# Patient Record
Sex: Female | Born: 1985 | Race: White | Hispanic: No | Marital: Married | Smoking: Former smoker
Health system: Southern US, Community
[De-identification: ages and names within clinical notes are randomized; demographics above are authoritative.]

## PROBLEM LIST (undated history)

## (undated) DIAGNOSIS — O139 Gestational [pregnancy-induced] hypertension without significant proteinuria, unspecified trimester: Secondary | ICD-10-CM

## (undated) DIAGNOSIS — O24419 Gestational diabetes mellitus in pregnancy, unspecified control: Secondary | ICD-10-CM

## (undated) DIAGNOSIS — R569 Unspecified convulsions: Secondary | ICD-10-CM

## (undated) DIAGNOSIS — R87810 Cervical high risk human papillomavirus (HPV) DNA test positive: Principal | ICD-10-CM

## (undated) DIAGNOSIS — R7401 Elevation of levels of liver transaminase levels: Secondary | ICD-10-CM

## (undated) DIAGNOSIS — R740 Nonspecific elevation of levels of transaminase and lactic acid dehydrogenase [LDH]: Secondary | ICD-10-CM

## (undated) DIAGNOSIS — M810 Age-related osteoporosis without current pathological fracture: Secondary | ICD-10-CM

## (undated) DIAGNOSIS — I639 Cerebral infarction, unspecified: Secondary | ICD-10-CM

## (undated) DIAGNOSIS — K219 Gastro-esophageal reflux disease without esophagitis: Secondary | ICD-10-CM

## (undated) DIAGNOSIS — F329 Major depressive disorder, single episode, unspecified: Secondary | ICD-10-CM

## (undated) DIAGNOSIS — F1911 Other psychoactive substance abuse, in remission: Secondary | ICD-10-CM

## (undated) DIAGNOSIS — F32A Depression, unspecified: Secondary | ICD-10-CM

## (undated) DIAGNOSIS — D649 Anemia, unspecified: Secondary | ICD-10-CM

## (undated) DIAGNOSIS — F1021 Alcohol dependence, in remission: Secondary | ICD-10-CM

## (undated) DIAGNOSIS — F419 Anxiety disorder, unspecified: Secondary | ICD-10-CM

## (undated) DIAGNOSIS — R87610 Atypical squamous cells of undetermined significance on cytologic smear of cervix (ASC-US): Secondary | ICD-10-CM

## (undated) HISTORY — DX: Major depressive disorder, single episode, unspecified: F32.9

## (undated) HISTORY — DX: Alcohol dependence, in remission: F10.21

## (undated) HISTORY — DX: Anxiety disorder, unspecified: F41.9

## (undated) HISTORY — DX: Cerebral infarction, unspecified: I63.9

## (undated) HISTORY — DX: Cervical high risk human papillomavirus (HPV) DNA test positive: R87.810

## (undated) HISTORY — DX: Elevation of levels of liver transaminase levels: R74.01

## (undated) HISTORY — DX: Depression, unspecified: F32.A

## (undated) HISTORY — PX: ELBOW SURGERY: SHX618

## (undated) HISTORY — DX: Age-related osteoporosis without current pathological fracture: M81.0

## (undated) HISTORY — DX: Atypical squamous cells of undetermined significance on cytologic smear of cervix (ASC-US): R87.610

## (undated) HISTORY — DX: Other psychoactive substance abuse, in remission: F19.11

## (undated) HISTORY — PX: TONSILLECTOMY: SHX5217

## (undated) HISTORY — PX: KNEE ARTHROSCOPY: SHX127

## (undated) HISTORY — DX: Gestational diabetes mellitus in pregnancy, unspecified control: O24.419

## (undated) HISTORY — DX: Nonspecific elevation of levels of transaminase and lactic acid dehydrogenase (ldh): R74.0

---

## 2010-05-27 ENCOUNTER — Inpatient Hospital Stay (HOSPITAL_COMMUNITY): Admission: EM | Admit: 2010-05-27 | Discharge: 2010-05-29 | Payer: Self-pay | Admitting: Emergency Medicine

## 2010-10-03 DIAGNOSIS — G459 Transient cerebral ischemic attack, unspecified: Secondary | ICD-10-CM

## 2010-10-03 DIAGNOSIS — I639 Cerebral infarction, unspecified: Secondary | ICD-10-CM

## 2010-10-03 HISTORY — DX: Transient cerebral ischemic attack, unspecified: G45.9

## 2010-10-03 HISTORY — DX: Cerebral infarction, unspecified: I63.9

## 2010-12-17 LAB — BASIC METABOLIC PANEL
BUN: 3 mg/dL — ABNORMAL LOW (ref 6–23)
CO2: 22 mEq/L (ref 19–32)
Calcium: 9.1 mg/dL (ref 8.4–10.5)
Chloride: 107 mEq/L (ref 96–112)
Creatinine, Ser: 0.65 mg/dL (ref 0.4–1.2)
GFR calc Af Amer: >60 mL/min (ref >60)
GFR calc non Af Amer: >60 mL/min (ref >60)
Glucose, Bld: 123 mg/dL — ABNORMAL HIGH (ref 70–99)
Potassium: 3.8 mEq/L (ref 3.5–5.1)
Sodium: 142 mEq/L (ref 135–145)

## 2010-12-17 LAB — URINALYSIS, ROUTINE W REFLEX MICROSCOPIC
Bilirubin Urine: NEGATIVE
Glucose, UA: 100 mg/dL — AB
Hgb urine dipstick: NEGATIVE
Ketones, ur: NEGATIVE mg/dL
Nitrite: POSITIVE — AB
Protein, ur: NEGATIVE mg/dL
Specific Gravity, Urine: 1.006 (ref 1.005–1.030)
Urobilinogen, UA: 0.2 mg/dL (ref 0.0–1.0)
pH: 5 (ref 5.0–8.0)

## 2010-12-17 LAB — HEPATITIS A ANTIBODY, TOTAL: Hep A Total Ab: NEGATIVE

## 2010-12-17 LAB — HEPATIC FUNCTION PANEL
ALT: 41 U/L — ABNORMAL HIGH (ref 0–35)
AST: 75 U/L — ABNORMAL HIGH (ref 0–37)
Albumin: 4 g/dL (ref 3.5–5.2)
Alkaline Phosphatase: 105 U/L (ref 39–117)
Bilirubin, Direct: <0.1 mg/dL (ref 0.0–0.3)
Total Bilirubin: 0.1 mg/dL — ABNORMAL LOW (ref 0.3–1.2)
Total Protein: 7.2 g/dL (ref 6.0–8.3)

## 2010-12-17 LAB — DIFFERENTIAL
Basophils Absolute: 0 10*3/uL (ref 0.0–0.1)
Basophils Absolute: 0 10*3/uL (ref 0.0–0.1)
Basophils Absolute: 0 10*3/uL (ref 0.0–0.1)
Basophils Relative: 0 % (ref 0–1)
Basophils Relative: 0 % (ref 0–1)
Basophils Relative: 0 % (ref 0–1)
Eosinophils Absolute: 0 10*3/uL (ref 0.0–0.7)
Eosinophils Absolute: 0.1 10*3/uL (ref 0.0–0.7)
Eosinophils Absolute: 0.1 10*3/uL (ref 0.0–0.7)
Eosinophils Relative: 0 % (ref 0–5)
Eosinophils Relative: 1 % (ref 0–5)
Eosinophils Relative: 1 % (ref 0–5)
Lymphocytes Relative: 18 % (ref 12–46)
Lymphocytes Relative: 22 % (ref 12–46)
Lymphocytes Relative: 24 % (ref 12–46)
Lymphs Abs: 1.6 10*3/uL (ref 0.7–4.0)
Lymphs Abs: 1.7 10*3/uL (ref 0.7–4.0)
Lymphs Abs: 2 10*3/uL (ref 0.7–4.0)
Monocytes Absolute: 0.3 10*3/uL (ref 0.1–1.0)
Monocytes Absolute: 0.5 10*3/uL (ref 0.1–1.0)
Monocytes Absolute: 0.5 10*3/uL (ref 0.1–1.0)
Monocytes Relative: 4 % (ref 3–12)
Monocytes Relative: 6 % (ref 3–12)
Monocytes Relative: 7 % (ref 3–12)
Neutro Abs: 4.9 10*3/uL (ref 1.7–7.7)
Neutro Abs: 6.4 10*3/uL (ref 1.7–7.7)
Neutro Abs: 7.1 10*3/uL (ref 1.7–7.7)
Neutrophils Relative %: 69 % (ref 43–77)
Neutrophils Relative %: 71 % (ref 43–77)
Neutrophils Relative %: 78 % — ABNORMAL HIGH (ref 43–77)

## 2010-12-17 LAB — COMPREHENSIVE METABOLIC PANEL
ALT: 23 U/L (ref 0–35)
ALT: 24 U/L (ref 0–35)
AST: 33 U/L (ref 0–37)
AST: 38 U/L — ABNORMAL HIGH (ref 0–37)
Albumin: 2.9 g/dL — ABNORMAL LOW (ref 3.5–5.2)
Albumin: 3.2 g/dL — ABNORMAL LOW (ref 3.5–5.2)
Alkaline Phosphatase: 75 U/L (ref 39–117)
Alkaline Phosphatase: 81 U/L (ref 39–117)
BUN: 2 mg/dL — ABNORMAL LOW (ref 6–23)
BUN: 3 mg/dL — ABNORMAL LOW (ref 6–23)
CO2: 23 mEq/L (ref 19–32)
CO2: 25 mEq/L (ref 19–32)
Calcium: 8.3 mg/dL — ABNORMAL LOW (ref 8.4–10.5)
Calcium: 9.1 mg/dL (ref 8.4–10.5)
Chloride: 105 mEq/L (ref 96–112)
Chloride: 108 mEq/L (ref 96–112)
Creatinine, Ser: 0.52 mg/dL (ref 0.4–1.2)
Creatinine, Ser: 0.62 mg/dL (ref 0.4–1.2)
GFR calc Af Amer: >60 mL/min (ref >60)
GFR calc Af Amer: >60 mL/min (ref >60)
GFR calc non Af Amer: >60 mL/min (ref >60)
GFR calc non Af Amer: >60 mL/min (ref >60)
Glucose, Bld: 101 mg/dL — ABNORMAL HIGH (ref 70–99)
Glucose, Bld: 126 mg/dL — ABNORMAL HIGH (ref 70–99)
Potassium: 3.4 mEq/L — ABNORMAL LOW (ref 3.5–5.1)
Potassium: 4.1 mEq/L (ref 3.5–5.1)
Sodium: 136 mEq/L (ref 135–145)
Sodium: 139 mEq/L (ref 135–145)
Total Bilirubin: 0.3 mg/dL (ref 0.3–1.2)
Total Bilirubin: 0.6 mg/dL (ref 0.3–1.2)
Total Protein: 5.4 g/dL — ABNORMAL LOW (ref 6.0–8.3)
Total Protein: 6.2 g/dL (ref 6.0–8.3)

## 2010-12-17 LAB — RAPID URINE DRUG SCREEN, HOSP PERFORMED
Amphetamines: NOT DETECTED
Barbiturates: NOT DETECTED
Benzodiazepines: POSITIVE — AB
Cocaine: NOT DETECTED
Opiates: NOT DETECTED
Tetrahydrocannabinol: NOT DETECTED

## 2010-12-17 LAB — CBC
HCT: 35.6 % — ABNORMAL LOW (ref 36.0–46.0)
HCT: 40.1 % (ref 36.0–46.0)
HCT: 43.1 % (ref 36.0–46.0)
Hemoglobin: 12.4 g/dL (ref 12.0–15.0)
Hemoglobin: 13.9 g/dL (ref 12.0–15.0)
Hemoglobin: 15.1 g/dL — ABNORMAL HIGH (ref 12.0–15.0)
MCH: 34.1 pg — ABNORMAL HIGH (ref 26.0–34.0)
MCH: 34.3 pg — ABNORMAL HIGH (ref 26.0–34.0)
MCH: 35.1 pg — ABNORMAL HIGH (ref 26.0–34.0)
MCHC: 34.7 g/dL (ref 30.0–36.0)
MCHC: 34.8 g/dL (ref 30.0–36.0)
MCHC: 35 g/dL (ref 30.0–36.0)
MCV: 100.2 fL — ABNORMAL HIGH (ref 78.0–100.0)
MCV: 97.8 fL (ref 78.0–100.0)
MCV: 99 fL (ref 78.0–100.0)
Platelets: 146 10*3/uL — ABNORMAL LOW (ref 150–400)
Platelets: 148 10*3/uL — ABNORMAL LOW (ref 150–400)
Platelets: 188 10*3/uL (ref 150–400)
RBC: 3.64 MIL/uL — ABNORMAL LOW (ref 3.87–5.11)
RBC: 4.05 MIL/uL (ref 3.87–5.11)
RBC: 4.3 MIL/uL (ref 3.87–5.11)
RDW: 13.2 % (ref 11.5–15.5)
RDW: 13.4 % (ref 11.5–15.5)
RDW: 13.7 % (ref 11.5–15.5)
WBC: 7.1 10*3/uL (ref 4.0–10.5)
WBC: 9 10*3/uL (ref 4.0–10.5)
WBC: 9.1 10*3/uL (ref 4.0–10.5)

## 2010-12-17 LAB — URINE MICROSCOPIC-ADD ON

## 2010-12-17 LAB — PROTIME-INR
INR: 0.89 (ref 0.00–1.49)
Prothrombin Time: 12.3 seconds (ref 11.6–15.2)

## 2010-12-17 LAB — LACTIC ACID, PLASMA: Lactic Acid, Venous: 2.4 mmol/L — ABNORMAL HIGH (ref 0.5–2.2)

## 2010-12-17 LAB — ETHANOL
Alcohol, Ethyl (B): 130 mg/dL — ABNORMAL HIGH (ref 0–10)
Alcohol, Ethyl (B): 408 mg/dL (ref 0–10)

## 2010-12-17 LAB — POCT I-STAT, CHEM 8
BUN: <3 mg/dL — ABNORMAL LOW (ref 6–23)
Calcium, Ion: 1.07 mmol/L — ABNORMAL LOW (ref 1.12–1.32)
Chloride: 106 mEq/L (ref 96–112)
Creatinine, Ser: 1 mg/dL (ref 0.4–1.2)
Glucose, Bld: 122 mg/dL — ABNORMAL HIGH (ref 70–99)
HCT: 46 % (ref 36.0–46.0)
Hemoglobin: 15.6 g/dL — ABNORMAL HIGH (ref 12.0–15.0)
Potassium: 3.8 mEq/L (ref 3.5–5.1)
Sodium: 141 mEq/L (ref 135–145)
TCO2: 20 mmol/L (ref 0–100)

## 2010-12-17 LAB — LIPASE, BLOOD: Lipase: 29 U/L (ref 11–59)

## 2010-12-17 LAB — ACETAMINOPHEN LEVEL: Acetaminophen (Tylenol), Serum: <10 ug/mL — ABNORMAL LOW (ref 10–30)

## 2010-12-17 LAB — APTT: aPTT: 25 seconds (ref 24–37)

## 2010-12-17 LAB — MAGNESIUM
Magnesium: 1.5 mg/dL (ref 1.5–2.5)
Magnesium: 1.8 mg/dL (ref 1.5–2.5)

## 2010-12-17 LAB — POCT PREGNANCY, URINE: Preg Test, Ur: NEGATIVE

## 2010-12-17 LAB — HEPATITIS B CORE ANTIBODY, TOTAL: Hep B Core Total Ab: NEGATIVE

## 2010-12-17 LAB — GAMMA GT: GGT: 196 U/L — ABNORMAL HIGH (ref 7–51)

## 2010-12-17 LAB — HEPATITIS C ANTIBODY: HCV Ab: NEGATIVE

## 2010-12-22 ENCOUNTER — Inpatient Hospital Stay: Payer: Self-pay | Admitting: Psychiatry

## 2011-03-04 ENCOUNTER — Emergency Department (HOSPITAL_COMMUNITY): Payer: Self-pay

## 2011-03-04 ENCOUNTER — Emergency Department (HOSPITAL_COMMUNITY)
Admission: EM | Admit: 2011-03-04 | Discharge: 2011-03-04 | Disposition: A | Discharge status: Home / Self Care | Payer: Self-pay | Attending: Emergency Medicine | Admitting: Emergency Medicine

## 2011-03-04 DIAGNOSIS — S060X0A Concussion without loss of consciousness, initial encounter: Secondary | ICD-10-CM | POA: Insufficient documentation

## 2011-03-04 DIAGNOSIS — S9030XA Contusion of unspecified foot, initial encounter: Secondary | ICD-10-CM | POA: Insufficient documentation

## 2011-03-04 DIAGNOSIS — Z79899 Other long term (current) drug therapy: Secondary | ICD-10-CM | POA: Insufficient documentation

## 2011-03-04 DIAGNOSIS — Y92009 Unspecified place in unspecified non-institutional (private) residence as the place of occurrence of the external cause: Secondary | ICD-10-CM | POA: Insufficient documentation

## 2011-03-04 DIAGNOSIS — S92309A Fracture of unspecified metatarsal bone(s), unspecified foot, initial encounter for closed fracture: Secondary | ICD-10-CM | POA: Insufficient documentation

## 2011-03-04 DIAGNOSIS — R609 Edema, unspecified: Secondary | ICD-10-CM | POA: Insufficient documentation

## 2011-03-04 DIAGNOSIS — M79609 Pain in unspecified limb: Secondary | ICD-10-CM | POA: Insufficient documentation

## 2011-03-04 DIAGNOSIS — F329 Major depressive disorder, single episode, unspecified: Secondary | ICD-10-CM | POA: Insufficient documentation

## 2011-03-04 DIAGNOSIS — R42 Dizziness and giddiness: Secondary | ICD-10-CM | POA: Insufficient documentation

## 2011-03-04 DIAGNOSIS — M7989 Other specified soft tissue disorders: Secondary | ICD-10-CM | POA: Insufficient documentation

## 2011-03-04 DIAGNOSIS — W1809XA Striking against other object with subsequent fall, initial encounter: Secondary | ICD-10-CM | POA: Insufficient documentation

## 2011-03-04 DIAGNOSIS — F3289 Other specified depressive episodes: Secondary | ICD-10-CM | POA: Insufficient documentation

## 2011-03-04 DIAGNOSIS — M899 Disorder of bone, unspecified: Secondary | ICD-10-CM | POA: Insufficient documentation

## 2011-03-25 ENCOUNTER — Emergency Department (HOSPITAL_COMMUNITY)
Admission: EM | Admit: 2011-03-25 | Discharge: 2011-03-25 | Disposition: A | Discharge status: Home / Self Care | Payer: Self-pay | Attending: Emergency Medicine | Admitting: Emergency Medicine

## 2011-03-25 DIAGNOSIS — W268XXA Contact with other sharp object(s), not elsewhere classified, initial encounter: Secondary | ICD-10-CM | POA: Insufficient documentation

## 2011-03-25 DIAGNOSIS — M899 Disorder of bone, unspecified: Secondary | ICD-10-CM | POA: Insufficient documentation

## 2011-03-25 DIAGNOSIS — Z79899 Other long term (current) drug therapy: Secondary | ICD-10-CM | POA: Insufficient documentation

## 2011-03-25 DIAGNOSIS — F329 Major depressive disorder, single episode, unspecified: Secondary | ICD-10-CM | POA: Insufficient documentation

## 2011-03-25 DIAGNOSIS — S61409A Unspecified open wound of unspecified hand, initial encounter: Secondary | ICD-10-CM | POA: Insufficient documentation

## 2011-03-25 DIAGNOSIS — F3289 Other specified depressive episodes: Secondary | ICD-10-CM | POA: Insufficient documentation

## 2011-07-08 ENCOUNTER — Inpatient Hospital Stay (HOSPITAL_COMMUNITY)
Admission: AD | Admit: 2011-07-08 | Discharge: 2011-07-12 | DRG: 897 | Disposition: A | Discharge status: Home / Self Care | Payer: PRIVATE HEALTH INSURANCE | Source: Ambulatory Visit | Attending: Psychiatry | Admitting: Psychiatry

## 2011-07-08 ENCOUNTER — Emergency Department (HOSPITAL_COMMUNITY)
Admission: EM | Admit: 2011-07-08 | Discharge: 2011-07-08 | Disposition: A | Discharge status: Other Acute Inpatient Hospital | Payer: Self-pay | Attending: Emergency Medicine | Admitting: Emergency Medicine

## 2011-07-08 DIAGNOSIS — M899 Disorder of bone, unspecified: Secondary | ICD-10-CM | POA: Insufficient documentation

## 2011-07-08 DIAGNOSIS — Z6379 Other stressful life events affecting family and household: Secondary | ICD-10-CM

## 2011-07-08 DIAGNOSIS — F3130 Bipolar disorder, current episode depressed, mild or moderate severity, unspecified: Secondary | ICD-10-CM | POA: Insufficient documentation

## 2011-07-08 DIAGNOSIS — X789XXA Intentional self-harm by unspecified sharp object, initial encounter: Secondary | ICD-10-CM | POA: Insufficient documentation

## 2011-07-08 DIAGNOSIS — M949 Disorder of cartilage, unspecified: Secondary | ICD-10-CM | POA: Insufficient documentation

## 2011-07-08 DIAGNOSIS — M9320 Osteochondritis dissecans of unspecified site: Secondary | ICD-10-CM

## 2011-07-08 DIAGNOSIS — F1020 Alcohol dependence, uncomplicated: Principal | ICD-10-CM

## 2011-07-08 DIAGNOSIS — Z880 Allergy status to penicillin: Secondary | ICD-10-CM

## 2011-07-08 DIAGNOSIS — S61509A Unspecified open wound of unspecified wrist, initial encounter: Secondary | ICD-10-CM | POA: Insufficient documentation

## 2011-07-08 DIAGNOSIS — F319 Bipolar disorder, unspecified: Secondary | ICD-10-CM

## 2011-07-08 DIAGNOSIS — R45851 Suicidal ideations: Secondary | ICD-10-CM

## 2011-07-08 LAB — DIFFERENTIAL
Basophils Absolute: 0.1 10*3/uL (ref 0.0–0.1)
Basophils Relative: 1 % (ref 0–1)
Eosinophils Absolute: 0.1 10*3/uL (ref 0.0–0.7)
Eosinophils Relative: 1 % (ref 0–5)
Lymphocytes Relative: 38 % (ref 12–46)
Lymphs Abs: 3.3 10*3/uL (ref 0.7–4.0)
Monocytes Absolute: 0.6 10*3/uL (ref 0.1–1.0)
Monocytes Relative: 7 % (ref 3–12)
Neutro Abs: 4.7 10*3/uL (ref 1.7–7.7)
Neutrophils Relative %: 54 % (ref 43–77)

## 2011-07-08 LAB — COMPREHENSIVE METABOLIC PANEL
ALT: 30 U/L (ref 0–35)
AST: 39 U/L — ABNORMAL HIGH (ref 0–37)
Albumin: 4.5 g/dL (ref 3.5–5.2)
Alkaline Phosphatase: 106 U/L (ref 39–117)
BUN: 6 mg/dL (ref 6–23)
CO2: 24 mEq/L (ref 19–32)
Calcium: 9.2 mg/dL (ref 8.4–10.5)
Chloride: 102 mEq/L (ref 96–112)
Creatinine, Ser: 0.52 mg/dL (ref 0.50–1.10)
GFR calc Af Amer: >90 mL/min (ref >90)
GFR calc non Af Amer: >90 mL/min (ref >90)
Glucose, Bld: 104 mg/dL — ABNORMAL HIGH (ref 70–99)
Potassium: 3.8 mEq/L (ref 3.5–5.1)
Sodium: 138 mEq/L (ref 135–145)
Total Bilirubin: 0.3 mg/dL (ref 0.3–1.2)
Total Protein: 7.8 g/dL (ref 6.0–8.3)

## 2011-07-08 LAB — ETHANOL
Alcohol, Ethyl (B): 434 mg/dL (ref 0–11)
Alcohol, Ethyl (B): 282 mg/dL — ABNORMAL HIGH (ref 0–11)

## 2011-07-08 LAB — POCT PREGNANCY, URINE: Preg Test, Ur: NEGATIVE

## 2011-07-08 LAB — CBC
HCT: 41.3 % (ref 36.0–46.0)
Hemoglobin: 14.9 g/dL (ref 12.0–15.0)
MCH: 32.1 pg (ref 26.0–34.0)
MCHC: 36.1 g/dL — ABNORMAL HIGH (ref 30.0–36.0)
MCV: 89 fL (ref 78.0–100.0)
Platelets: 265 10*3/uL (ref 150–400)
RBC: 4.64 MIL/uL (ref 3.87–5.11)
RDW: 14.2 % (ref 11.5–15.5)
WBC: 8.7 10*3/uL (ref 4.0–10.5)

## 2011-07-08 LAB — RAPID URINE DRUG SCREEN, HOSP PERFORMED
Amphetamines: NOT DETECTED
Barbiturates: NOT DETECTED
Benzodiazepines: NOT DETECTED
Cocaine: NOT DETECTED
Opiates: NOT DETECTED
Tetrahydrocannabinol: NOT DETECTED

## 2011-07-08 LAB — ACETAMINOPHEN LEVEL: Acetaminophen (Tylenol), Serum: <15 ug/mL (ref 10–30)

## 2011-07-08 LAB — SALICYLATE LEVEL: Salicylate Lvl: <2 mg/dL — ABNORMAL LOW (ref 2.8–20.0)

## 2011-07-09 DIAGNOSIS — F319 Bipolar disorder, unspecified: Secondary | ICD-10-CM

## 2011-07-09 DIAGNOSIS — F1020 Alcohol dependence, uncomplicated: Secondary | ICD-10-CM
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
## 2011-09-04 ENCOUNTER — Emergency Department (HOSPITAL_COMMUNITY)
Admission: EM | Admit: 2011-09-04 | Discharge: 2011-09-04 | Disposition: A | Discharge status: Home / Self Care | Payer: Self-pay | Attending: Emergency Medicine | Admitting: Emergency Medicine

## 2011-09-04 DIAGNOSIS — F1020 Alcohol dependence, uncomplicated: Secondary | ICD-10-CM | POA: Insufficient documentation

## 2011-09-04 DIAGNOSIS — R112 Nausea with vomiting, unspecified: Secondary | ICD-10-CM | POA: Insufficient documentation

## 2011-09-04 DIAGNOSIS — F411 Generalized anxiety disorder: Secondary | ICD-10-CM | POA: Insufficient documentation

## 2011-09-04 LAB — COMPREHENSIVE METABOLIC PANEL
ALT: 18 U/L (ref 0–35)
AST: 32 U/L (ref 0–37)
Albumin: 4.2 g/dL (ref 3.5–5.2)
Alkaline Phosphatase: 99 U/L (ref 39–117)
BUN: 7 mg/dL (ref 6–23)
CO2: 22 mEq/L (ref 19–32)
Calcium: 9.2 mg/dL (ref 8.4–10.5)
Chloride: 103 mEq/L (ref 96–112)
Creatinine, Ser: 0.59 mg/dL (ref 0.50–1.10)
GFR calc Af Amer: >90 mL/min (ref >90)
GFR calc non Af Amer: >90 mL/min (ref >90)
Glucose, Bld: 96 mg/dL (ref 70–99)
Potassium: 3.8 mEq/L (ref 3.5–5.1)
Sodium: 141 mEq/L (ref 135–145)
Total Bilirubin: 0.2 mg/dL — ABNORMAL LOW (ref 0.3–1.2)
Total Protein: 7.4 g/dL (ref 6.0–8.3)

## 2011-09-04 LAB — CBC
HCT: 43 % (ref 36.0–46.0)
Hemoglobin: 15.3 g/dL — ABNORMAL HIGH (ref 12.0–15.0)
MCH: 31.4 pg (ref 26.0–34.0)
MCHC: 35.6 g/dL (ref 30.0–36.0)
MCV: 88.3 fL (ref 78.0–100.0)
Platelets: 288 10*3/uL (ref 150–400)
RBC: 4.87 MIL/uL (ref 3.87–5.11)
RDW: 12.6 % (ref 11.5–15.5)
WBC: 10.2 10*3/uL (ref 4.0–10.5)

## 2011-09-04 LAB — RAPID URINE DRUG SCREEN, HOSP PERFORMED
Amphetamines: NOT DETECTED
Barbiturates: NOT DETECTED
Benzodiazepines: NOT DETECTED
Cocaine: NOT DETECTED
Opiates: NOT DETECTED
Tetrahydrocannabinol: POSITIVE — AB

## 2011-09-04 LAB — PREGNANCY, URINE: Preg Test, Ur: NEGATIVE

## 2011-09-04 LAB — ETHANOL: Alcohol, Ethyl (B): 287 mg/dL — ABNORMAL HIGH (ref 0–11)

## 2011-09-04 MED ORDER — LORAZEPAM 1 MG PO TABS
1.0000 mg | ORAL_TABLET | Freq: Once | ORAL | Status: AC
  Administered 2011-09-04: 1 mg via ORAL
  Filled 2011-09-04: qty 1

## 2011-09-04 MED ORDER — ONDANSETRON HCL 4 MG PO TABS: 4.0000 mg | ORAL_TABLET | Freq: Four times a day (QID) | ORAL | Status: AC

## 2011-09-04 NOTE — ED Provider Notes (Signed)
History     CSN: [id number] Arrival date & time: 09/04/2011  3:21 PM   First MD Initiated Contact with Patient 09/04/11 1638      Chief Complaint  Patient presents with  . V70.1    (Consider location/radiation/quality/duration/timing/severity/associated sxs/prior treatment) HPI  Pt presented to the ED with complaints of nausea, vomiting and feeling sick. Pt states that she never asked for detox from alcohol and is having a panic attack. She is  already at pt at [facility] outpatient. She says that her roommate has been having nausea, vomiting and diarrhea and because she caught his bug, she is unable to keep her alcohol down and is afraid she is beginning to go into withdrawals as well. She says that she has stopped going to her AA meetings and attending [facility] like she should, but will call her doctor tomorrow for more medicaiton because she is out. She denies HI, SI. And to re-iterate, she does not want detox, she needs help to stop vomiting and is here as a sick patient.  History reviewed. No pertinent past medical history.  History reviewed. No pertinent past surgical history.  History reviewed. No pertinent family history.  History  Substance Use Topics  . Smoking status: Current Everyday Smoker  . Smokeless tobacco: Not on file  . Alcohol Use: Yes    OB History    Grav Para Term Preterm Abortions TAB SAB Ect Mult Living                  Review of Systems  Allergies  Penicillins  Home Medications  No current outpatient prescriptions on file.  BP 128/83  Pulse 120  Temp(Src) 98.7 F (37.1 C) (Oral)  Resp 20  SpO2 100%  LMP 07/23/2011  Physical Exam  Nursing note and vitals reviewed. Constitutional: She appears well-developed and well-nourished.  HENT:  Head: Normocephalic and atraumatic.  Eyes: Conjunctivae are normal. Pupils are equal, round, and reactive to light.  Neck: Trachea normal, normal range of motion and full passive range of motion without  pain. Neck supple.  Cardiovascular: Normal rate, regular rhythm and normal pulses.   Pulmonary/Chest: Effort normal and breath sounds normal. Chest wall is not dull to percussion. She exhibits no tenderness, no crepitus, no edema, no deformity and no retraction.  Abdominal: Soft. Normal appearance and bowel sounds are normal.  Musculoskeletal: Normal range of motion.  Neurological: She is alert. She has normal strength.  Skin: Skin is warm, dry and intact.  Psychiatric: Her behavior is normal. Judgment normal. Her mood appears anxious. Her speech is not delayed, not tangential and not slurred. She is not agitated, not aggressive, is not hyperactive, not withdrawn and not combative. Thought content is not paranoid and not delusional. Cognition and memory are normal. She expresses homicidal ideation. She expresses no suicidal ideation. She expresses no suicidal plans and no homicidal plans. She is communicative.    ED Course  Procedures (including critical care time)  Labs Reviewed  CBC - Abnormal; Notable for the following:    Hemoglobin 15.3 (*)    All other components within normal limits  COMPREHENSIVE METABOLIC PANEL - Abnormal; Notable for the following:    Total Bilirubin 0.2 (*)    All other components within normal limits  ETHANOL - Abnormal; Notable for the following:    Alcohol, Ethyl (B) 287 (*)    All other components within normal limits  PREGNANCY, URINE  URINE RAPID DRUG SCREEN (HOSP PERFORMED)   No results found.  No diagnosis found.    MDM  Pt given Ativan to calm down. I have advised pt to call her doctor tomorrow and get plugged back in to her [facility] program.        Dorthula Matas, [place] 09/04/11 1710

## 2011-09-04 NOTE — ED Provider Notes (Signed)
Medical screening examination/treatment/procedure(s) were performed by non-physician practitioner and as supervising physician I was immediately available for consultation/collaboration.  Akshay Spang R Eusevio Schriver, MD 09/04/11 1847 

## 2011-09-04 NOTE — ED Notes (Signed)
Pt in request detox from [facility] states last drink was ago denies h/i state "i feel like i want to be dead" denies s/i with a plan

## 2013-07-30 LAB — DRUG SCREEN, URINE
Amphetamines, Ur Screen: NEGATIVE (ref ?–1000)
Barbiturates, Ur Screen: NEGATIVE (ref ?–200)
Benzodiazepine, Ur Scrn: NEGATIVE (ref ?–200)
Cannabinoid 50 Ng, Ur ~~LOC~~: POSITIVE (ref ?–50)
Cocaine Metabolite,Ur ~~LOC~~: NEGATIVE (ref ?–300)
MDMA (Ecstasy)Ur Screen: NEGATIVE (ref ?–500)
Methadone, Ur Screen: NEGATIVE (ref ?–300)
Opiate, Ur Screen: NEGATIVE (ref ?–300)
Phencyclidine (PCP) Ur S: NEGATIVE (ref ?–25)
Tricyclic, Ur Screen: NEGATIVE (ref ?–1000)

## 2013-07-30 LAB — COMPREHENSIVE METABOLIC PANEL
Albumin: 3.8 g/dL (ref 3.4–5.0)
Alkaline Phosphatase: 88 U/L (ref 50–136)
Anion Gap: 7 (ref 7–16)
BUN: 8 mg/dL (ref 7–18)
Bilirubin,Total: 0.3 mg/dL (ref 0.2–1.0)
Calcium, Total: 8.7 mg/dL (ref 8.5–10.1)
Chloride: 111 mmol/L — ABNORMAL HIGH (ref 98–107)
Co2: 25 mmol/L (ref 21–32)
Creatinine: 0.69 mg/dL (ref 0.60–1.30)
EGFR (African American): >60
EGFR (Non-African Amer.): >60
Glucose: 113 mg/dL — ABNORMAL HIGH (ref 65–99)
Osmolality: 284 (ref 275–301)
Potassium: 3.9 mmol/L (ref 3.5–5.1)
SGOT(AST): 27 U/L (ref 15–37)
SGPT (ALT): 29 U/L (ref 12–78)
Sodium: 143 mmol/L (ref 136–145)
Total Protein: 8 g/dL (ref 6.4–8.2)

## 2013-07-30 LAB — URINALYSIS, COMPLETE
Bilirubin,UR: NEGATIVE
Blood: NEGATIVE
Glucose,UR: NEGATIVE mg/dL (ref 0–75)
Ketone: NEGATIVE
Leukocyte Esterase: NEGATIVE
Nitrite: NEGATIVE
Ph: 6 (ref 4.5–8.0)
Protein: NEGATIVE
RBC,UR: 1 /HPF (ref 0–5)
Specific Gravity: 1.014 (ref 1.003–1.030)
Squamous Epithelial: 6
WBC UR: 3 /HPF (ref 0–5)

## 2013-07-30 LAB — CBC
HCT: 40.9 % (ref 35.0–47.0)
HGB: 14 g/dL (ref 12.0–16.0)
MCH: 28 pg (ref 26.0–34.0)
MCHC: 34.2 g/dL (ref 32.0–36.0)
MCV: 82 fL (ref 80–100)
Platelet: 351 10*3/uL (ref 150–440)
RBC: 4.99 10*6/uL (ref 3.80–5.20)
RDW: 16.7 % — ABNORMAL HIGH (ref 11.5–14.5)
WBC: 10.7 10*3/uL (ref 3.6–11.0)

## 2013-07-30 LAB — TSH: Thyroid Stimulating Horm: 1.51 u[IU]/mL

## 2013-07-30 LAB — PREGNANCY, URINE: Pregnancy Test, Urine: NEGATIVE m[IU]/mL

## 2013-07-30 LAB — ETHANOL
Ethanol %: 0.307 % (ref 0.000–0.080)
Ethanol: 307 mg/dL

## 2013-07-31 ENCOUNTER — Inpatient Hospital Stay: Payer: Self-pay | Admitting: Psychiatry

## 2013-08-01 LAB — BEHAVIORAL MEDICINE 1 PANEL
Albumin: 3.4 g/dL (ref 3.4–5.0)
Alkaline Phosphatase: 80 U/L (ref 50–136)
Anion Gap: 7 (ref 7–16)
BUN: 7 mg/dL (ref 7–18)
Basophil #: 0.1 10*3/uL (ref 0.0–0.1)
Basophil %: 1.2 %
Bilirubin,Total: 0.6 mg/dL (ref 0.2–1.0)
Calcium, Total: 9.2 mg/dL (ref 8.5–10.1)
Chloride: 106 mmol/L (ref 98–107)
Co2: 24 mmol/L (ref 21–32)
Creatinine: 0.78 mg/dL (ref 0.60–1.30)
EGFR (African American): >60
EGFR (Non-African Amer.): >60
Eosinophil #: 0.1 10*3/uL (ref 0.0–0.7)
Eosinophil %: 2.3 %
Glucose: 91 mg/dL (ref 65–99)
HCT: 38.8 % (ref 35.0–47.0)
HGB: 13.3 g/dL (ref 12.0–16.0)
Lymphocyte #: 2 10*3/uL (ref 1.0–3.6)
Lymphocyte %: 39.8 %
MCH: 28.3 pg (ref 26.0–34.0)
MCHC: 34.3 g/dL (ref 32.0–36.0)
MCV: 83 fL (ref 80–100)
Monocyte #: 0.4 x10 3/mm (ref 0.2–0.9)
Monocyte %: 7.6 %
Neutrophil #: 2.5 10*3/uL (ref 1.4–6.5)
Neutrophil %: 49.1 %
Osmolality: 271 (ref 275–301)
Platelet: 260 10*3/uL (ref 150–440)
Potassium: 4.1 mmol/L (ref 3.5–5.1)
RBC: 4.69 10*6/uL (ref 3.80–5.20)
RDW: 16.3 % — ABNORMAL HIGH (ref 11.5–14.5)
SGOT(AST): 28 U/L (ref 15–37)
SGPT (ALT): 23 U/L (ref 12–78)
Sodium: 137 mmol/L (ref 136–145)
Thyroid Stimulating Horm: 2.55 u[IU]/mL
Total Protein: 7 g/dL (ref 6.4–8.2)
WBC: 5.1 10*3/uL (ref 3.6–11.0)

## 2014-03-15 ENCOUNTER — Emergency Department: Payer: Self-pay | Admitting: Emergency Medicine

## 2015-01-23 NOTE — Discharge Summary (Signed)
PATIENT NAME:  Rachel [name], Rachel [name] MR#:  [id number] DATE OF BIRTH:  July 21, 1986  DATE OF ADMISSION:  07/31/2013 DATE OF DISCHARGE:  08/02/2013  HOSPITAL COURSE: See dictated history and physical for details of admission. [name] 29 year old woman admitted to the hospital for alcohol withdrawal. She was treated with standard detox protocol. Did not have [name] seizure did not have delirium. She was physically stable and did not require any extra medication. By the time of discharge, was not showing any active signs of alcohol withdrawal. Affect and mood were stable. She was agreeable to follow-up treatment in the community. She was treated with counseling and individual and group psychotherapy. She was referred to [facility]. She was educated about the importance of sobriety and maintaining mood stability in the future.   DISCHARGE MEDICATIONS: None.   MENTAL STATUS EXAM AT DISCHARGE: Casually dressed, neatly groomed woman, looks her stated age, cooperative with the interview. Good eye contact, normal psychomotor activity. Speech normal rate, tone and volume. Affect euthymic, reactive, appropriate. Mood stated as fine. Thoughts are lucid. No evidence of loosening of associations or delusions. Denies auditory or visual hallucinations. Denies suicidal or homicidal ideation. Shows good insight and judgment. Normal intelligence. Alert and oriented x 4.   LABORATORY RESULTS: Admission labs showed [name] normal CBC, glucose elevated at 113 on [name] nonfasting draw, chloride elevated at 111, initial alcohol level 307. TSH normal. Urinalysis unremarkable. Drug screen positive for cannabis. Pregnancy test negative.   DISPOSITION: Discharge home. Follow up with [facility].   DIAGNOSIS, PRINCIPAL AND PRIMARY:   AXIS I: Alcohol dependence.   SECONDARY DIAGNOSES: AXIS I: Cannabis abuse.   AXIS II: Deferred.   AXIS III: No diagnosis.   AXIS III: Moderate from chronic alcohol use and financial difficulties.   AXIS V: Functioning at time of  discharge is 55.     ____________________________ Rachel AmelJohn T. Katianne Barre, MD jtc:cc D: 08/12/2013 22:53:36 ET T: 08/12/2013 23:28:51 ET JOB#: [id number]  cc: Rachel AmelJohn T. Aadon Gorelik, MD, <Dictator> Rachel AmelJOHN T Rachel Delcastillo MD ELECTRONICALLY SIGNED 08/13/2013 11:05

## 2015-01-23 NOTE — H&P (Signed)
PATIENT NAME:  Rachel [name], Rachel [name] MR#:  [id number] DATE OF BIRTH:  1986/09/07  DATE OF ADMISSION:  07/30/2013   IDENTIFYING INFORMATION AND CHIEF COMPLAINT: [name] 29 year old woman with [name] history of alcohol abuse comes to the hospital stating that she had been drinking heavily and been having suicidal thoughts.   HISTORY OF PRESENT ILLNESS: Information obtained from the patient and the chart. The patient came to the hospital last night with [name] blood alcohol level over 300, reports that she has been drinking heavily and has been doing so for most of this year. She is feeling overwhelmed by stresses. [name] close friend of hers died by suicide recently, which has caused her to feel more depressed and more guilty. She feels run down and tired [name] lot of the time. She is denying to me today that she is having any suicidal ideation. She feels that she has had no success in trying to stop drinking on her own and wants to get detoxed in [name] hospital. She denies that she is abusing any other drugs, although her drug screen is positive for cannabis.   PAST PSYCHIATRIC HISTORY: The patient has had [name] previous admission here [name] couple of years ago for detox. After that admission she was referred to the alcohol and drug abuse treatment center. She said that after that she did not follow up with any outpatient treatment. Longest sobriety as an adult is about three week's total. She says she has [name] history of DTs, denies any history of withdrawal seizures. She is not currently taking any psychiatric medicine. She has been prescribed antidepressants in the past. She denies any history of suicide attempts or violence.   FAMILY HISTORY: Both parents with substance abuse problems, including father who died with alcohol abuse and [name] mother who continues to abuse cocaine.   SOCIAL HISTORY: She lives with an aunt. She works managing coffee shops. Says that she has [name] busy work schedule. [name] close friend died of [name] heroin overdose recently.   PAST  MEDICAL HISTORY: History of human papilloma virus, but denies any other significant ongoing medical problems.   SUBSTANCE ABUSE HISTORY: As noted above, long history of alcohol dependence, also history of cocaine and marijuana and opiate abuse, in the past, smokes regularly.   REVIEW OF SYSTEMS: Mood is feeling down. She has had some recent passive suicidal ideation without intent. Not reporting any psychotic symptoms. Today, feeling hungry, recently had not been eating very well.   CURRENT MEDICATIONS: None.   ALLERGIES: AMOXICILLIN.   MENTAL STATUS EXAMINATION: Slightly disheveled woman, looks her stated age, agreeable and cooperative with the interview. Eye contact poor. Psychomotor activity [name] little fidgety and slow. Speech normal in rate and tone. Affect blunted, slightly irritable. Mood stated as being not so great. Thoughts are lucid. No evidence of loosening of associations or delusions. Does not report any hallucinations. No acute suicidal or homicidal ideation. Intelligence normal. Judgment and insight adequate. Alert and oriented x 4.   PHYSICAL EXAMINATION: GENERAL: The patient does not appear to be in acute physical distress. Multiple tattoos, but no acute skin lesions identified.  HEENT: Pupils equal and reactive. Face symmetric. Oral mucosa dry.  NECK AND BACK: Nontender to light palpation. Full range of motion at extremities. Reflexes and strength appear to be normal throughout. Cranial nerves symmetric and normal.  LUNGS: Clear to auscultation without wheezes.  HEART: Regular rate and rhythm.  ABDOMEN: Soft, nontender, normal bowel sounds.   VITAL SIGNS: Temperature 97.9, pulse 115, respirations  16, blood pressure 99/60.   LABORATORY RESULTS: Chemistry panel: No really significant abnormalities, slightly elevated glucose on [name] nonfasting draw. CBC normal. Alcohol level last night 307. TSH normal. Urinalysis unremarkable. Drug screen positive for cannabis. Pregnancy test  negative.   ASSESSMENT: [name] 29 year old woman with alcohol dependence presents requesting with detox. She is currently cooperative with treatment. Showing some signs of acute alcohol withdrawal. Affect dysphoric. Would benefit from hospital stabilization.   TREATMENT PLAN: Admit to psychiatry. Detox protocol in place. Monitor vital signs and labs. Engage patient in groups and activities on the unit. Supportive and educational therapy. Work on suggesting appropriate outpatient treatment in the community. No other psychiatric medicine indicated at this time.   DIAGNOSIS, PRINCIPAL AND PRIMARY:   AXIS I: Alcohol dependence.   SECONDARY DIAGNOSES: AXIS I:  Marijuana abuse.   AXIS II: Deferred.   AXIS III: No acute diagnosis except alcohol withdrawal.   AXIS IV: Severe from ongoing substance abuse, recent acute death of [name] friend.   AXIS V: Functioning at time of evaluation is 40.     ____________________________ Audery Amel, MD jtc:cc D: 07/31/2013 17:23:14 ET T: 07/31/2013 18:17:32 ET JOB#: [id number]  cc: Audery Amel, MD, <Dictator> Audery Amel MD ELECTRONICALLY SIGNED 07/31/2013 21:27

## 2015-02-04 LAB — HM PAP SMEAR

## 2016-10-31 DIAGNOSIS — R7401 Elevation of levels of liver transaminase levels: Secondary | ICD-10-CM

## 2016-10-31 DIAGNOSIS — F1021 Alcohol dependence, in remission: Secondary | ICD-10-CM

## 2016-10-31 DIAGNOSIS — F329 Major depressive disorder, single episode, unspecified: Secondary | ICD-10-CM | POA: Insufficient documentation

## 2016-10-31 DIAGNOSIS — R740 Nonspecific elevation of levels of transaminase and lactic acid dehydrogenase [LDH]: Secondary | ICD-10-CM

## 2016-10-31 DIAGNOSIS — R87810 Cervical high risk human papillomavirus (HPV) DNA test positive: Principal | ICD-10-CM

## 2016-10-31 DIAGNOSIS — R87610 Atypical squamous cells of undetermined significance on cytologic smear of cervix (ASC-US): Secondary | ICD-10-CM | POA: Insufficient documentation

## 2016-10-31 DIAGNOSIS — F32A Depression, unspecified: Secondary | ICD-10-CM | POA: Insufficient documentation

## 2016-10-31 DIAGNOSIS — F1911 Other psychoactive substance abuse, in remission: Secondary | ICD-10-CM

## 2016-11-15 ENCOUNTER — Ambulatory Visit: Payer: Self-pay | Admitting: Family Medicine

## 2016-11-15 ENCOUNTER — Ambulatory Visit (INDEPENDENT_AMBULATORY_CARE_PROVIDER_SITE_OTHER): Payer: BLUE CROSS/BLUE SHIELD | Admitting: Family Medicine

## 2016-11-15 ENCOUNTER — Encounter: Payer: Self-pay | Admitting: Family Medicine

## 2016-11-15 VITALS — BP 109/70 | Temp 98.4°F | Ht 60.0 in | Wt 137.0 lb

## 2016-11-15 DIAGNOSIS — F331 Major depressive disorder, recurrent, moderate: Secondary | ICD-10-CM | POA: Diagnosis not present

## 2016-11-15 MED ORDER — BUPROPION HCL ER (XL) 150 MG PO TB24
150.0000 mg | ORAL_TABLET | Freq: Every day | ORAL | 0 refills | Status: DC
Start: 2016-11-15 — End: 2016-12-12

## 2016-11-15 NOTE — Patient Instructions (Signed)
Follow up in 1 month   

## 2016-11-15 NOTE — Progress Notes (Signed)
BP 109/70   Temp 98.4 F (36.9 C)   Ht 5' (1.524 m)   Wt 137 lb (62.1 kg)   LMP 11/01/2016 (Approximate)   SpO2 98%   BMI 26.76 kg/m    Subjective:    Patient ID: Rachel [name], female    DOB: May 20, 1986, 31 y.o.   MRN: [id number]  HPI: Rachel [name] is a 31 y.o. female  Chief Complaint  Patient presents with  . Establish Care    wasn't happy with previous doctor  . Depression    has been on lexapro fpr about 4 months, doesn't like it. Has no sex drive with it.    Patient presents today to establish care. Doing very well overall. Previous hx of drug and alcohol abuse, coming up on 3 years sober anniversary and thriving. Recently engaged, stable relationship. Only concern today is her depression medication. Pt was on Prozac for years, did not tolerate the medication well. Previous provider switched her to lexapro about 4 months ago and since she has been experiencing fatigue, weight gain, and severe sexual side effects. States these things are starting to affect her relationship with her fiance and she has been very unhappy about these changes. Has been off and on antidepressants for about 15 years now. Eventually wanting to wean completely off of them but feels that until the wedding is behind her she wants to be on something. Feels that her moods are well controlled, and no SI/HI, disordered eating, or other concerns.   Relevant past medical, surgical, family and social history reviewed and updated as indicated. Interim medical history since our last visit reviewed. Allergies and medications reviewed and updated.  Depression screen PHQ 2/9 11/15/2016  Decreased Interest 1  Down, Depressed, Hopeless 0  PHQ - 2 Score 1  Altered sleeping 1  Tired, decreased energy 3  Change in appetite 3  Feeling bad or failure about yourself  0  Trouble concentrating 0  Moving slowly or fidgety/restless 0  Suicidal thoughts 0  PHQ-9 Score 8  Difficult doing work/chores Somewhat difficult     Review of Systems  Constitutional: Positive for fatigue and unexpected weight change.  HENT: Negative.   Respiratory: Negative.   Cardiovascular: Negative.   Gastrointestinal: Negative.   Genitourinary:       Decreased libido  Musculoskeletal: Negative.   Neurological: Negative.   Psychiatric/Behavioral: Negative.     Per HPI unless specifically indicated above     Objective:    BP 109/70   Temp 98.4 F (36.9 C)   Ht 5' (1.524 m)   Wt 137 lb (62.1 kg)   LMP 11/01/2016 (Approximate)   SpO2 98%   BMI 26.76 kg/m   Wt Readings from Last 3 Encounters:  11/15/16 137 lb (62.1 kg)    Physical Exam  Constitutional: She is oriented to person, place, and time. She appears well-developed and well-nourished. No distress.  HENT:  Head: Atraumatic.  Eyes: Conjunctivae are normal. Pupils are equal, round, and reactive to light.  Neck: Normal range of motion. Neck supple.  Cardiovascular: Normal rate, regular rhythm and normal heart sounds.   Pulmonary/Chest: Effort normal and breath sounds normal. No respiratory distress.  Musculoskeletal: Normal range of motion.  Neurological: She is alert and oriented to person, place, and time.  Skin: Skin is warm and dry.  Psychiatric: She has a normal mood and affect. Her behavior is normal. Judgment and thought content normal.  Nursing note and vitals reviewed.  Assessment & Plan:   Problem List Items Addressed This Visit      Other   Depression - Primary    Long discussion with patient about her goals over the next 6-12 months. Pt would like to hold off on weaning completely off and would rather switch to a different medication. After discussing some options, patient wishes to start wellbutrin. Risks and benefits discussed. Will see her back in 1 month to assess how medication is going.       Relevant Medications   buPROPion (WELLBUTRIN XL) 150 MG 24 hr tablet       Follow up plan: Return in about 4 weeks (around  12/13/2016).

## 2016-11-15 NOTE — Assessment & Plan Note (Signed)
Long discussion with patient about her goals over the next 6-12 months. Pt would like to hold off on weaning completely off and would rather switch to a different medication. After discussing some options, patient wishes to start wellbutrin. Risks and benefits discussed. Will see her back in 1 month to assess how medication is going.

## 2016-12-05 ENCOUNTER — Encounter: Payer: Self-pay | Admitting: Family Medicine

## 2016-12-12 ENCOUNTER — Other Ambulatory Visit: Payer: Self-pay | Admitting: Family Medicine

## 2016-12-12 NOTE — Telephone Encounter (Signed)
Routing to provider. Follow up on 12/14/16.

## 2016-12-14 ENCOUNTER — Encounter: Payer: Self-pay | Admitting: Family Medicine

## 2016-12-14 ENCOUNTER — Ambulatory Visit (INDEPENDENT_AMBULATORY_CARE_PROVIDER_SITE_OTHER): Payer: BLUE CROSS/BLUE SHIELD | Admitting: Family Medicine

## 2016-12-14 VITALS — BP 107/71 | HR 94 | Temp 99.0°F | Wt 136.0 lb

## 2016-12-14 DIAGNOSIS — Z309 Encounter for contraceptive management, unspecified: Secondary | ICD-10-CM | POA: Diagnosis not present

## 2016-12-14 DIAGNOSIS — F331 Major depressive disorder, recurrent, moderate: Secondary | ICD-10-CM | POA: Diagnosis not present

## 2016-12-14 MED ORDER — NORGESTIM-ETH ESTRAD TRIPHASIC 0.18/0.215/0.25 MG-35 MCG PO TABS: 1.0000 | ORAL_TABLET | Freq: Every day | ORAL | 11 refills | Status: DC

## 2016-12-14 NOTE — Progress Notes (Signed)
BP 107/71   Pulse 94   Temp 99 F (37.2 C)   Wt 136 lb (61.7 kg)   LMP 11/23/2016 (Approximate)   SpO2 98%   BMI 26.56 kg/m    Subjective:    Patient ID: Rachel [name], female    DOB: March 12, 1986, 30 y.o.   MRN: [id number]  HPI: Rachel [name] is a 31 y.o. female  Chief Complaint  Patient presents with  . Depression    Doing well on the Wellbutrin. Had some itching, but stopped for a day and it went away.   Depression Patient presents to clinic for 1 month depression follow up after restarting Wellbutrin. Had 1-2 days of severe itching about 2 weeks into taking the medication and was told to try tapering down to see if it goes away. Sxs dissipated after she skipped a day, and the itching has not happened since restarting it following a day off.  She has been having some increased brief episodes of agitation, but wondering if it is mostly because her previous antidepressant sedated her so much. Her concentration has improved, as has her severe fatigue and lethargy.  No weight gain, sexual dysfunction.  She had one episode of fleeting suicidal ideation 2 days ago while dealing with a stressful work situation and states this was an isolated incident and she did not have a plan.  Would also like a refill on her OCPs. Has been stable on current medication x 6 months, no concerns. Taking regularly.   Depression screen [facility] 2/9 12/14/2016 11/15/2016  Decreased Interest 1 1  Down, Depressed, Hopeless 1 0  PHQ - 2 Score 2 1  Altered sleeping 0 1  Tired, decreased energy 0 3  Change in appetite 0 3  Feeling bad or failure about yourself  1 0  Trouble concentrating 0 0  Moving slowly or fidgety/restless 0 0  Suicidal thoughts 0 0  PHQ-9 Score 3 8  Difficult doing work/chores Somewhat difficult Somewhat difficult    Past Medical History:  Diagnosis Date  . Anxiety   . ASCUS with positive high risk HPV cervical   . Depression   . Elevated transaminase level   . History of alcoholism  (HCC)   . History of substance abuse   . 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 Osteoporosis    Social History   Social History  . Marital status: Single    Spouse name: N/A  . Number of children: N/A  . Years of education: N/A   Occupational History  . Not on file.   Social History Main Topics  . Smoking status: Former Smoker    Years: 15.00    Quit date: 05/15/2016  . Smokeless tobacco: Never Used  . Alcohol use No     Comment: in the past, sober since 2015  . Drug use: No     Comment: in the past  . Sexual activity: Not on file   Other Topics Concern  . Not on file   Social History Narrative  . No narrative on file    Relevant past medical, surgical, family and social history reviewed and updated as indicated. Interim medical history since our last visit reviewed. Allergies and medications reviewed and updated.  Review of Systems  Constitutional: Negative.   HENT: Negative.   Respiratory: Negative.   Cardiovascular: Negative.   Gastrointestinal: Negative.   Genitourinary: Negative.   Musculoskeletal: Negative.   Skin: Negative for rash.  Neurological: Negative for  headaches.  Psychiatric/Behavioral: Positive for agitation and suicidal ideas. Negative for confusion, decreased concentration, self-injury and sleep disturbance. The patient is not nervous/anxious and is not hyperactive.    Per HPI unless specifically indicated above     Objective:    BP 107/71   Pulse 94   Temp 99 F (37.2 C)   Wt 136 lb (61.7 kg)   LMP 11/23/2016 (Approximate)   SpO2 98%   BMI 26.56 kg/m   Wt Readings from Last 3 Encounters:  12/14/16 136 lb (61.7 kg)  11/15/16 137 lb (62.1 kg)    Physical Exam  Constitutional: She is oriented to person, place, and time. She appears well-developed and well-nourished. No distress.  HENT:  Head: Normocephalic and atraumatic.  Right Ear: External ear normal.  Left Ear: External ear normal.  Eyes: Conjunctivae are normal. Right  eye exhibits no discharge. Left eye exhibits no discharge.  Neck: Normal range of motion. Neck supple.  Cardiovascular: Normal rate and normal heart sounds.   Pulmonary/Chest: Effort normal.  Musculoskeletal: Normal range of motion.  Neurological: She is alert and oriented to person, place, and time.  Skin: Skin is warm and dry.  Psychiatric: She has a normal mood and affect. Her speech is normal and behavior is normal. Judgment and thought content normal. She is not agitated. She expresses no homicidal and no suicidal ideation.  Nursing note and vitals reviewed.     Assessment & Plan:   Problem List Items Addressed This Visit    Depression    Discussed the option of tapering off Wellbutrin and starting an alternative, but patient states she would like to continue taking Wellbutrin to give time for her body to adjust since she is no longer having itching. Given strict precautions about alerting the clinic if she has suicidal or homicidal ideation or agitation worsens. Patient's fiance was also counseled on being vigilant about any behavioral changes and concerns for her safety or well being and expressed understanding.        Other Visit Diagnoses    Encounter for contraceptive management, unspecified type    -  Primary   Refilled Tri-Previfem.       Follow up plan: Return in about 4 weeks (around 01/11/2017) for depression follow up.

## 2016-12-14 NOTE — Assessment & Plan Note (Signed)
Discussed the option of tapering off Wellbutrin and starting an alternative, but patient states she would like to continue taking Wellbutrin to give time for her body to adjust since she is no longer having itching. Given strict precautions about alerting the clinic if she has suicidal or homicidal ideation or agitation worsens. Patient's fiance was also counseled on being vigilant about any behavioral changes and concerns for her safety or well being and expressed understanding.

## 2016-12-15 NOTE — Patient Instructions (Signed)
Follow up in 1 month   

## 2017-01-13 ENCOUNTER — Ambulatory Visit (INDEPENDENT_AMBULATORY_CARE_PROVIDER_SITE_OTHER): Payer: BLUE CROSS/BLUE SHIELD | Admitting: Family Medicine

## 2017-01-13 ENCOUNTER — Encounter: Payer: Self-pay | Admitting: Family Medicine

## 2017-01-13 VITALS — BP 115/76 | HR 80 | Ht 64.0 in | Wt 135.0 lb

## 2017-01-13 DIAGNOSIS — F331 Major depressive disorder, recurrent, moderate: Secondary | ICD-10-CM

## 2017-01-13 MED ORDER — BUPROPION HCL ER (XL) 300 MG PO TB24: 300.0000 mg | ORAL_TABLET | Freq: Every day | ORAL | 1 refills | Status: DC

## 2017-01-13 NOTE — Assessment & Plan Note (Signed)
Much improved. Increase to 300 mg wellbutrin as she did very well on this dose intermittently over the past month. Call with any questions or concerns prior to next visit.

## 2017-01-13 NOTE — Patient Instructions (Signed)
Follow up in 6 months 

## 2017-01-13 NOTE — Progress Notes (Signed)
BP 115/76   Pulse 80   Ht  (1.626 m)   Wt 135 lb (61.2 kg)   SpO2 98%   BMI 23.17 kg/m    Subjective:    Patient ID: Rachel [name], female    DOB: [birth year], 31 y.o.   MRN: [id number]  HPI: Rachel [name] is a 31 y.o. female  Chief Complaint  Patient presents with  . Follow-up   Patient presents for depression follow up. Was started on Wellbutrin 150 mg about 2 months ago. Initially, had 2 days of severe itching that were thought to be from the medication but has since not had a repeat episode. Was experiencing severe agitation and anxiety during the first month on the medication, but states she has greatly improved and feels very level, stable, and calm. On days where she knew she would be under increased stress, she took two 150 mg tabs and did even better with that dose. Wanting to increase to this dose daily. Denies any SI/HI, having improved relationship with fiance, and has started working out again.   Still having occasional spells of anxious thoughts, mostly related to large groups of people, but states they are very tolerable at this point unlike previously where they were crippling.   Depression screen [facility] 2/9 01/13/2017 12/14/2016 11/15/2016  Decreased Interest Down, Depressed, Hopeless 0 1 0  PHQ - 2 Score Altered sleeping - 0 1  Tired, decreased energy - 0 3  Change in appetite - 0 3  Feeling bad or failure about yourself  - 1 0  Trouble concentrating - 0 0  Moving slowly or fidgety/restless - 0 0  Suicidal thoughts - 0 0  PHQ-9 Score - 3 8  Difficult doing work/chores - Somewhat difficult Somewhat difficult    Relevant past medical, surgical, family and social history reviewed and updated as indicated. Interim medical history since our last visit reviewed. Allergies and medications reviewed and updated.  Review of Systems  Constitutional: Negative.   HENT: Negative.   Respiratory: Negative.   Cardiovascular: Negative.   Gastrointestinal:  Negative.   Neurological: Negative.   Psychiatric/Behavioral: The patient is nervous/anxious (rarely).     Per HPI unless specifically indicated above     Objective:    BP 115/76   Pulse 80   Ht  (1.626 m)   Wt 135 lb (61.2 kg)   SpO2 98%   BMI 23.17 kg/m   Wt Readings from Last 3 Encounters:  01/13/17 135 lb (61.2 kg)  12/14/16 136 lb (61.7 kg)  11/15/16 137 lb (62.1 kg)    Physical Exam  Constitutional: She is oriented to person, place, and time. She appears well-developed and well-nourished. No distress.  HENT:  Head: Atraumatic.  Eyes: Conjunctivae are normal. Pupils are equal, round, and reactive to light.  Neck: Normal range of motion. Neck supple.  Cardiovascular: Normal rate and normal heart sounds.   Pulmonary/Chest: Effort normal and breath sounds normal.  Musculoskeletal: Normal range of motion.  Neurological: She is alert and oriented to person, place, and time.  Skin: Skin is warm and dry.  Psychiatric: She has a normal mood and affect. Her behavior is normal.  Nursing note and vitals reviewed.     Assessment & Plan:   Problem List Items Addressed This Visit      Other   Depression - Primary    Much improved. Increase to 300 mg wellbutrin as she did  very well on this dose intermittently over the past month. Call with any questions or concerns prior to next visit.       Relevant Medications   buPROPion (WELLBUTRIN XL) 300 MG 24 hr tablet       Follow up plan: Return in about 6 months (around 07/15/2017) for Depression f/u, Physical.

## 2017-03-23 ENCOUNTER — Encounter: Payer: Self-pay | Admitting: Family Medicine

## 2017-03-23 ENCOUNTER — Ambulatory Visit (INDEPENDENT_AMBULATORY_CARE_PROVIDER_SITE_OTHER): Payer: BLUE CROSS/BLUE SHIELD | Admitting: Family Medicine

## 2017-03-23 VITALS — BP 113/79 | HR 68 | Temp 98.7°F | Wt 135.0 lb

## 2017-03-23 DIAGNOSIS — J069 Acute upper respiratory infection, unspecified: Secondary | ICD-10-CM

## 2017-03-23 DIAGNOSIS — R51 Headache: Secondary | ICD-10-CM

## 2017-03-23 DIAGNOSIS — R519 Headache, unspecified: Secondary | ICD-10-CM

## 2017-03-23 MED ORDER — KETOROLAC TROMETHAMINE 60 MG/2ML IM SOLN
60.0000 mg | Freq: Once | INTRAMUSCULAR | Status: AC
  Administered 2017-03-23: 60 mg via INTRAMUSCULAR

## 2017-03-23 NOTE — Progress Notes (Signed)
   BP 113/79   Pulse 68   Temp 98.7 F (37.1 C) (Oral)   Wt 135 lb (61.2 kg)   SpO2 98%   BMI 23.17 kg/m    Subjective:    Patient ID: Rachel [name], female    DOB: 09/23/1986, 31 y.o.   MRN: [id number]  HPI: Rachel Cadetrica A [name] is a 31 y.o. female  Chief Complaint  Patient presents with  . Sore Throat    Since tuesday. taste chemically.    Patient presents with 3 day hx of sore throat that has progressed to HA and congestion. Sore throat is resolving. Denies fever, chills, SOB, wheezing, ear pain, sinus pressure. Taking advil and dayquil prn for sxs. Has been exposed to sick family member recently.   Relevant past medical, surgical, family and social history reviewed and updated as indicated. Interim medical history since our last visit reviewed. Allergies and medications reviewed and updated.  Review of Systems  Constitutional: Negative.   HENT: Positive for congestion and sore throat.   Respiratory: Negative.   Cardiovascular: Negative.   Gastrointestinal: Negative.   Genitourinary: Negative.   Musculoskeletal: Negative.   Neurological: Positive for headaches.  Psychiatric/Behavioral: Negative.     Per HPI unless specifically indicated above     Objective:    BP 113/79   Pulse 68   Temp 98.7 F (37.1 C) (Oral)   Wt 135 lb (61.2 kg)   SpO2 98%   BMI 23.17 kg/m   Wt Readings from Last 3 Encounters:  03/23/17 135 lb (61.2 kg)  01/13/17 135 lb (61.2 kg)  12/14/16 136 lb (61.7 kg)    Physical Exam  Constitutional: She is oriented to person, place, and time. She appears well-developed and well-nourished. No distress.  HENT:  Head: Atraumatic.  Right Ear: External ear normal.  Left Ear: External ear normal.  Nose: Nose normal.  Mouth/Throat: Oropharynx is clear and moist.  Eyes: Conjunctivae are normal. Pupils are equal, round, and reactive to light. No scleral icterus.  Neck: Normal range of motion. Neck supple.  Cardiovascular: Normal rate, regular rhythm and  normal heart sounds.   Pulmonary/Chest: Effort normal and breath sounds normal. No respiratory distress.  Musculoskeletal: Normal range of motion.  Neurological: She is alert and oriented to person, place, and time.  Skin: Skin is warm and dry.  Psychiatric: She has a normal mood and affect. Her behavior is normal.  Nursing note and vitals reviewed.     Assessment & Plan:   Problem List Items Addressed This Visit    None    Visit Diagnoses    Viral URI    -  Primary   Continue advil and dayquil/nyquil. Start mucinex, push fluids, rest. F/u if worsening or no improvement   Acute nonintractable headache, unspecified headache type       IM Toradol given today. Cold compresses over head, sinus rinses, mucinex, rest. F/u if worsening or no improvement   Relevant Medications   ketorolac (TORADOL) injection 60 mg (Completed)       Follow up plan: Return for as scheduled.

## 2017-07-06 ENCOUNTER — Other Ambulatory Visit: Payer: Self-pay | Admitting: Family Medicine

## 2017-07-06 NOTE — Telephone Encounter (Signed)
Routing to provider. Appt on 07/25/17. 

## 2017-07-25 ENCOUNTER — Encounter: Payer: Self-pay | Admitting: Family Medicine

## 2017-07-25 ENCOUNTER — Ambulatory Visit (INDEPENDENT_AMBULATORY_CARE_PROVIDER_SITE_OTHER): Payer: BLUE CROSS/BLUE SHIELD | Admitting: Family Medicine

## 2017-07-25 VITALS — BP 99/66 | HR 77 | Temp 99.6°F | Ht 61.0 in | Wt 130.0 lb

## 2017-07-25 DIAGNOSIS — F331 Major depressive disorder, recurrent, moderate: Secondary | ICD-10-CM | POA: Diagnosis not present

## 2017-07-25 DIAGNOSIS — Z0000 Encounter for general adult medical examination without abnormal findings: Secondary | ICD-10-CM | POA: Diagnosis not present

## 2017-07-25 DIAGNOSIS — Z23 Encounter for immunization: Secondary | ICD-10-CM | POA: Diagnosis not present

## 2017-07-25 LAB — MICROSCOPIC EXAMINATION: Epithelial Cells (non renal): >10 /hpf — AB (ref 0–10)

## 2017-07-25 LAB — UA/M W/RFLX CULTURE, ROUTINE
Bilirubin, UA: NEGATIVE
Glucose, UA: NEGATIVE
Leukocytes, UA: NEGATIVE
Nitrite, UA: NEGATIVE
Protein, UA: NEGATIVE
RBC, UA: NEGATIVE
Specific Gravity, UA: 1.025 (ref 1.005–1.030)
Urobilinogen, Ur: 0.2 mg/dL (ref 0.2–1.0)
pH, UA: 6 (ref 5.0–7.5)

## 2017-07-25 MED ORDER — SERTRALINE HCL 50 MG PO TABS: 50.0000 mg | ORAL_TABLET | Freq: Every day | ORAL | 0 refills | Status: DC

## 2017-07-25 NOTE — Patient Instructions (Signed)
Follow up in 1 month   

## 2017-07-25 NOTE — Assessment & Plan Note (Signed)
Excellent response to wellbutrin, but will transition to zoloft as she is hoping to become pregnant soon with the intention on going back to wellbutrin after delivery. Follow up in 1 month to monitor response to zoloft. Risks and benefits reviewed

## 2017-07-25 NOTE — Progress Notes (Signed)
BP 99/66   Pulse 77   Temp 99.6 F (37.6 C)   Ht 5\' 1"  (1.549 m)   Wt 130 lb (59 kg)   SpO2 98%   BMI 24.56 kg/m    Subjective:    Patient ID: Rachel [name], female    DOB: 09/23/1986, 31 y.o.   MRN: [id number]  HPI: Rachel [name] is a 31 y.o. female presenting on 07/25/2017 for comprehensive medical examination. Current medical complaints include:depression f/u  Sees women's health for female exams, last visit about 8 months ago.   Doing very well on Wellbutrin 300 mg, but hoping to start trying for a baby soon and wondering if the medication is pregnancy safe. Denies SI/HI, uncontrollable mood swings, crying spells, anhedonia, fatigue.   She currently lives with: husband  Depression Screen done today and results listed below:  [facility]2017 01/13/2017 12/14/2016 11/15/2016  Decreased Interest 0 1 1 1   Down, Depressed, Hopeless 0 0 1 0  PHQ - 2 Score 0 1 2 1   Altered sleeping 1 - 0 1  Tired, decreased energy 1 - 0 3  Change in appetite 0 - 0 3  Feeling bad or failure about yourself  0 - 1 0  Trouble concentrating 0 - 0 0  Moving slowly or fidgety/restless 0 - 0 0  Suicidal thoughts 0 - 0 0  PHQ-9 Score - - 3 8  Difficult doing work/chores - - Somewhat difficult Somewhat difficult    The patient does not have a history of falls. I did not complete a risk assessment for falls. A plan of care for falls was not documented.   Past Medical History:  Past Medical History:  Diagnosis Date  . Anxiety   . ASCUS with positive high risk HPV cervical   . Depression   . Elevated transaminase level   . History of alcoholism (HCC)   . History of substance abuse   . 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 Osteoporosis     Surgical History:  Past Surgical History:  Procedure Laterality Date  . KNEE ARTHROSCOPY    . TONSILLECTOMY      Medications:  Current Outpatient Prescriptions on File Prior to Visit  Medication Sig  . buPROPion (WELLBUTRIN  XL) 300 MG 24 hr tablet TAKE 1 TABLET (300 MG TOTAL) BY MOUTH DAILY.  Marland Kitchen. Norgestimate-Ethinyl Estradiol Triphasic (TRI-PREVIFEM) 0.18/0.215/0.25 MG-35 MCG tablet Take 1 tablet by mouth daily.  . [DISCONTINUED] escitalopram (LEXAPRO) 10 MG tablet Take 10 mg by mouth daily.   No current facility-administered medications on file prior to visit.     Allergies:  Allergies  Allergen Reactions  . Amoxicillin Shortness Of Breath  . Penicillins     Social History:  Social History   Social History  . Marital status: Single    Spouse name: N/A  . Number of children: N/A  . Years of education: N/A   Occupational History  . Not on file.   Social History Main Topics  . Smoking status: Former Smoker    Years: 15.00    Quit date: 05/15/2016  . Smokeless tobacco: Never Used  . Alcohol use No     Comment: in the past, sober since 2015  . Drug use: No     Comment: in the past  . Sexual activity: Not on file   Other Topics Concern  . Not on file   Social History Narrative  . No narrative on file  History  Smoking Status  . Former Smoker  . Years: 15.00  . Quit date: 05/15/2016  Smokeless Tobacco  . Never Used   History  Alcohol Use No    Comment: in the past, sober since 2015    Family History:  Family History  Problem Relation Age of Onset  . Alcohol abuse Mother   . Depression Mother   . Cancer Mother        skin  . Heart disease Mother   . Drug abuse Mother   . Heart attack Mother        drug related  . Alcohol abuse Father   . COPD Paternal Grandfather   . Diabetes Paternal Grandfather   . Stroke Neg Hx     Past medical history, surgical history, medications, allergies, family history and social history reviewed with patient today and changes made to appropriate areas of the chart.   Review of Systems - General ROS: negative Psychological ROS: negative Ophthalmic ROS: negative ENT ROS: negative Breast ROS: negative for breast lumps Respiratory ROS: no  cough, shortness of breath, or wheezing Cardiovascular ROS: no chest pain or dyspnea on exertion Gastrointestinal ROS: no abdominal pain, change in bowel habits, or black or bloody stools Genito-Urinary ROS: no dysuria, trouble voiding, or hematuria Musculoskeletal ROS: negative Neurological ROS: no TIA or stroke symptoms Dermatological ROS: negative All other ROS negative except what is listed above and in the HPI.      Objective:    BP 99/66   Pulse 77   Temp 99.6 F (37.6 C)   Ht 5\' 1"  (1.549 m)   Wt 130 lb (59 kg)   SpO2 98%   BMI 24.56 kg/m   Wt Readings from Last 3 Encounters:  07/25/17 130 lb (59 kg)  03/23/17 135 lb (61.2 kg)  01/13/17 135 lb (61.2 kg)    Physical Exam  Constitutional: She is oriented to person, place, and time. She appears well-developed and well-nourished. No distress.  HENT:  Head: Atraumatic.  Right Ear: External ear normal.  Left Ear: External ear normal.  Nose: Nose normal.  Mouth/Throat: Oropharynx is clear and moist. No oropharyngeal exudate.  Eyes: Pupils are equal, round, and reactive to light. Conjunctivae are normal. No scleral icterus.  Neck: Normal range of motion. Neck supple. No thyromegaly present.  Cardiovascular: Normal rate, regular rhythm, normal heart sounds and intact distal pulses.   Pulmonary/Chest: Effort normal and breath sounds normal. No respiratory distress.  Abdominal: Soft. Bowel sounds are normal. She exhibits no mass. There is no tenderness.  Musculoskeletal: Normal range of motion. She exhibits no edema or tenderness.  Lymphadenopathy:    She has no cervical adenopathy.  Neurological: She is alert and oriented to person, place, and time. No cranial nerve deficit.  Skin: Skin is warm and dry. No rash noted.  Psychiatric: She has a normal mood and affect. Her behavior is normal.  Nursing note and vitals reviewed.   Results for orders placed or performed in visit on 07/25/17  Microscopic Examination  Result  Value Ref Range   WBC, UA 0-5 0 - 5 /hpf   RBC, UA 0-2 0 - 2 /hpf   Epithelial Cells (non renal) >10 (A) 0 - 10 /hpf   Bacteria, UA Few None seen/Few  UA/M w/rflx Culture, Routine  Result Value Ref Range   Specific Gravity, UA 1.025 1.005 - 1.030   pH, UA 6.0 5.0 - 7.5   Color, UA Yellow Yellow   Appearance Ur  Cloudy (A) Clear   Leukocytes, UA Negative Negative   Protein, UA Negative Negative/Trace   Glucose, UA Negative Negative   Ketones, UA Trace (A) Negative   RBC, UA Negative Negative   Bilirubin, UA Negative Negative   Urobilinogen, Ur 0.2 0.2 - 1.0 mg/dL   Nitrite, UA Negative Negative   Microscopic Examination See below:       Assessment & Plan:   Problem List Items Addressed This Visit      Other   Depression - Primary    Excellent response to wellbutrin, but will transition to zoloft as she is hoping to become pregnant soon with the intention on going back to wellbutrin after delivery. Follow up in 1 month to monitor response to zoloft. Risks and benefits reviewed      Relevant Medications   sertraline (ZOLOFT) 50 MG tablet    Other Visit Diagnoses    Annual physical exam       Fasting labs drawn today, await results. UTD on health maintenance, flu shot given today   Relevant Orders   CBC with Differential/Platelet   Comprehensive metabolic panel   Lipid Panel w/o Chol/HDL Ratio   TSH   UA/M w/rflx Culture, Routine (Completed)   Need for immunization against influenza       Relevant Orders   Flu Vaccine QUAD 36+ mos IM (Completed)       Follow up plan: Return in about 4 weeks (around 08/22/2017) for Depression f/u.   LABORATORY TESTING:  - Pap smear: up to date  IMMUNIZATIONS:   - Tdap: Tetanus vaccination status reviewed: last tetanus booster within 10 years. - Influenza: Administered today  PATIENT COUNSELING:   Advised to take 1 mg of folate supplement per day if capable of pregnancy.   Sexuality: Discussed sexually transmitted diseases,  partner selection, use of condoms, avoidance of unintended pregnancy  and contraceptive alternatives.   Advised to avoid cigarette smoking.  I discussed with the patient that most people either abstain from alcohol or drink within safe limits (<=14/week and <=4 drinks/occasion for males, <=7/weeks and <= 3 drinks/occasion for females) and that the risk for alcohol disorders and other health effects rises proportionally with the number of drinks per week and how often a drinker exceeds daily limits.  Discussed cessation/primary prevention of drug use and availability of treatment for abuse.   Diet: Encouraged to adjust caloric intake to maintain  or achieve ideal body weight, to reduce intake of dietary saturated fat and total fat, to limit sodium intake by avoiding high sodium foods and not adding table salt, and to maintain adequate dietary potassium and calcium preferably from fresh fruits, vegetables, and low-fat dairy products.    stressed the importance of regular exercise  Injury prevention: Discussed safety belts, safety helmets, smoke detector, smoking near bedding or upholstery.   Dental health: Discussed importance of regular tooth brushing, flossing, and dental visits.    NEXT PREVENTATIVE PHYSICAL DUE IN 1 YEAR. Return in about 4 weeks (around 08/22/2017) for Depression f/u.

## 2017-07-26 LAB — CBC WITH DIFFERENTIAL/PLATELET
Basophils Absolute: 0 10*3/uL (ref 0.0–0.2)
Basos: 1 %
EOS (ABSOLUTE): 0.1 10*3/uL (ref 0.0–0.4)
Eos: 1 %
Hematocrit: 39.8 % (ref 34.0–46.6)
Hemoglobin: 13.4 g/dL (ref 11.1–15.9)
Immature Grans (Abs): 0 10*3/uL (ref 0.0–0.1)
Immature Granulocytes: 0 %
Lymphocytes Absolute: 2.1 10*3/uL (ref 0.7–3.1)
Lymphs: 29 %
MCH: 29.4 pg (ref 26.6–33.0)
MCHC: 33.7 g/dL (ref 31.5–35.7)
MCV: 87 fL (ref 79–97)
Monocytes Absolute: 0.5 10*3/uL (ref 0.1–0.9)
Monocytes: 6 %
Neutrophils Absolute: 4.4 10*3/uL (ref 1.4–7.0)
Neutrophils: 63 %
Platelets: 273 10*3/uL (ref 150–379)
RBC: 4.56 x10E6/uL (ref 3.77–5.28)
RDW: 13.3 % (ref 12.3–15.4)
WBC: 7.1 10*3/uL (ref 3.4–10.8)

## 2017-07-26 LAB — LIPID PANEL W/O CHOL/HDL RATIO
Cholesterol, Total: 225 mg/dL — ABNORMAL HIGH (ref 100–199)
HDL: 70 mg/dL (ref >39)
LDL Calculated: 130 mg/dL — ABNORMAL HIGH (ref 0–99)
Triglycerides: 125 mg/dL (ref 0–149)
VLDL Cholesterol Cal: 25 mg/dL (ref 5–40)

## 2017-07-26 LAB — COMPREHENSIVE METABOLIC PANEL
ALT: 15 IU/L (ref 0–32)
AST: 14 IU/L (ref 0–40)
Albumin/Globulin Ratio: 1.7 (ref 1.2–2.2)
Albumin: 4.2 g/dL (ref 3.5–5.5)
Alkaline Phosphatase: 45 IU/L (ref 39–117)
BUN/Creatinine Ratio: 14 (ref 9–23)
BUN: 11 mg/dL (ref 6–20)
Bilirubin Total: 0.3 mg/dL (ref 0.0–1.2)
CO2: 22 mmol/L (ref 20–29)
Calcium: 9.3 mg/dL (ref 8.7–10.2)
Chloride: 100 mmol/L (ref 96–106)
Creatinine, Ser: 0.77 mg/dL (ref 0.57–1.00)
GFR calc Af Amer: 119 mL/min/{1.73_m2} (ref >59)
GFR calc non Af Amer: 103 mL/min/{1.73_m2} (ref >59)
Globulin, Total: 2.5 g/dL (ref 1.5–4.5)
Glucose: 89 mg/dL (ref 65–99)
Potassium: 4.4 mmol/L (ref 3.5–5.2)
Sodium: 137 mmol/L (ref 134–144)
Total Protein: 6.7 g/dL (ref 6.0–8.5)

## 2017-07-26 LAB — TSH: TSH: 1.86 u[IU]/mL (ref 0.450–4.500)

## 2017-08-22 ENCOUNTER — Encounter: Payer: Self-pay | Admitting: Family Medicine

## 2017-08-22 ENCOUNTER — Ambulatory Visit: Payer: BLUE CROSS/BLUE SHIELD | Admitting: Family Medicine

## 2017-08-22 VITALS — BP 95/62 | HR 74 | Temp 98.3°F | Wt 132.6 lb

## 2017-08-22 DIAGNOSIS — F331 Major depressive disorder, recurrent, moderate: Secondary | ICD-10-CM | POA: Diagnosis not present

## 2017-08-22 DIAGNOSIS — Z308 Encounter for other contraceptive management: Secondary | ICD-10-CM | POA: Diagnosis not present

## 2017-08-22 MED ORDER — SERTRALINE HCL 50 MG PO TABS: 50.0000 mg | ORAL_TABLET | Freq: Every day | ORAL | 1 refills | Status: DC

## 2017-08-22 NOTE — Progress Notes (Signed)
BP 95/62 (BP Location: Left Arm, Patient Position: Sitting, Cuff Size: Normal)   Pulse 74   Temp 98.3 F (36.8 C) (Oral)   Wt 132 lb 9.6 oz (60.1 kg)   SpO2 100%   BMI 25.05 kg/m    Subjective:    Patient ID: Rachel [name], female    DOB: 05-03-1986, 31 y.o.   MRN: [id number]  HPI: Rachel [name] is a 31 y.o. female  Chief Complaint  Patient presents with  . Depression    4 week F/U. Patient states she is doing good on Zoloft. No complaints.   Patient presents for 1 month f/u after switching from wellbutrin to zoloft as she is now trying to conceive. Was doing quite well on wellbutrin, still feels like she's well controlled with the zoloft but does have a few mild side effects of fatigue, decreased sex drive, and increased appetite. Moods well controlled, no SI/HI, crying spells. Taking medication faithfully. Husband notes she seems to be doing very well also.   Pt also notes she is on her last week of her birth control pack and wanting to know how to come off of it.   Relevant past medical, surgical, family and social history reviewed and updated as indicated. Interim medical history since our last visit reviewed. Allergies and medications reviewed and updated.  Review of Systems  Constitutional: Positive for fatigue.  HENT: Negative.   Respiratory: Negative.   Cardiovascular: Negative.   Gastrointestinal: Negative.   Genitourinary: Negative.   Musculoskeletal: Negative.   Neurological: Negative.   Psychiatric/Behavioral: Negative.    Per HPI unless specifically indicated above     Objective:    BP 95/62 (BP Location: Left Arm, Patient Position: Sitting, Cuff Size: Normal)   Pulse 74   Temp 98.3 F (36.8 C) (Oral)   Wt 132 lb 9.6 oz (60.1 kg)   SpO2 100%   BMI 25.05 kg/m   Wt Readings from Last 3 Encounters:  08/22/17 132 lb 9.6 oz (60.1 kg)  07/25/17 130 lb (59 kg)  03/23/17 135 lb (61.2 kg)    Physical Exam  Constitutional: She is oriented to person,  place, and time. She appears well-developed and well-nourished. No distress.  HENT:  Head: Atraumatic.  Eyes: Conjunctivae are normal. Pupils are equal, round, and reactive to light. No scleral icterus.  Neck: Normal range of motion. Neck supple.  Cardiovascular: Normal rate and normal heart sounds.  Pulmonary/Chest: Effort normal and breath sounds normal.  Musculoskeletal: Normal range of motion.  Neurological: She is alert and oriented to person, place, and time.  Skin: Skin is warm and dry.  Psychiatric: She has a normal mood and affect. Her behavior is normal.  Nursing note and vitals reviewed.     Assessment & Plan:   Problem List Items Addressed This Visit      Other   Depression - Primary    Doing fairly well with the switch to Zoloft. Having much less side effects than she's had with other SSRIs in the past (has failed prozac and lexapro previously). Continue current regimen, pt does not wish to increase dose at this time.       Relevant Medications   sertraline (ZOLOFT) 50 MG tablet    Other Visit Diagnoses    Encounter for other contraceptive management       Pt to come off birth control in efforts to conceive after this cycle. Discussed starting prenatal vitamin, watching for mood changes or issues there carefully  Follow up plan: Return in about 6 months (around 02/19/2018) for Depression f/u.

## 2017-08-25 NOTE — Patient Instructions (Signed)
Follow up in 6 months 

## 2017-08-25 NOTE — Assessment & Plan Note (Signed)
Doing fairly well with the switch to Zoloft. Having much less side effects than she's had with other SSRIs in the past (has failed prozac and lexapro previously). Continue current regimen, pt does not wish to increase dose at this time.

## 2017-09-27 ENCOUNTER — Telehealth: Payer: Self-pay

## 2017-09-27 NOTE — Telephone Encounter (Signed)
Likely an automated message, will disregard and continue as discussed at OV

## 2017-09-27 NOTE — Telephone Encounter (Signed)
Received fax from pharmacy.  90 day supply request for Bupropion 300mg  tablet.  No phone number in chart to call patient. Last fill was 07/06/17. Not sure if this was an automated fax, but office notes state patient will go back on wellbutrin after delivery because she's hoping to become pregnant soon.

## 2017-10-01 ENCOUNTER — Other Ambulatory Visit: Payer: Self-pay | Admitting: Family Medicine

## 2018-02-15 ENCOUNTER — Other Ambulatory Visit: Payer: Self-pay | Admitting: Family Medicine

## 2018-02-15 NOTE — Telephone Encounter (Signed)
Sertraline (Zoloft) refill Last OV: 08/22/17  Last Refill:08/22/17 PCP: Roosvelt Maser PA-C

## 2018-02-20 ENCOUNTER — Ambulatory Visit: Payer: BLUE CROSS/BLUE SHIELD | Admitting: Family Medicine

## 2018-03-12 ENCOUNTER — Other Ambulatory Visit: Payer: Self-pay | Admitting: Family Medicine

## 2018-03-13 NOTE — Telephone Encounter (Signed)
Needs an appointment. Will get her enough medicine to make it to appointment when it's booked.   

## 2018-03-13 NOTE — Telephone Encounter (Signed)
Zoloft 50 mg refill request  LOV 08/22/17 with Roosvelt Maserachel Lane.   Had appt 02/20/18 cancelled by pt.    Per notes needs appt.  [facility] [address] [place] [place] - [address]

## 2018-03-13 NOTE — Telephone Encounter (Signed)
Pt scheduled for 03/29/18.

## 2018-03-29 ENCOUNTER — Ambulatory Visit: Payer: 59 | Admitting: Family Medicine

## 2018-03-29 ENCOUNTER — Encounter: Payer: Self-pay | Admitting: Family Medicine

## 2018-03-29 VITALS — BP 109/72 | HR 75 | Temp 98.6°F | Wt 133.6 lb

## 2018-03-29 DIAGNOSIS — F331 Major depressive disorder, recurrent, moderate: Secondary | ICD-10-CM | POA: Diagnosis not present

## 2018-03-29 MED ORDER — SERTRALINE HCL 50 MG PO TABS: 50.0000 mg | ORAL_TABLET | Freq: Every day | ORAL | 1 refills | Status: DC

## 2018-03-29 NOTE — Progress Notes (Signed)
BP 109/72 (BP Location: Left Arm, Patient Position: Sitting, Cuff Size: Normal)   Pulse 75   Temp 98.6 F (37 C)   Wt 133 lb 9 oz (60.6 kg)   BMI 25.24 kg/m    Subjective:    Patient ID: Rachel [name], female    DOB: [birth year], 32 y.o.   MRN: [id number]  HPI: ARIANN [name] is a 32 y.o. female  Chief Complaint  Patient presents with  . Depression   DEPRESSION Mood status: controlled Satisfied with current treatment?: yes Symptom severity: mild  Duration of current treatment : chronic Side effects: no Medication compliance: excellent compliance Psychotherapy/counseling: no  Previous psychiatric medications: sertaline Depressed mood: yes Anxious mood: yes Anhedonia: no Significant weight loss or gain: no Insomnia: no  Fatigue: yes Feelings of worthlessness or guilt: no Impaired concentration/indecisiveness: no Suicidal ideations: no Hopelessness: no Crying spells: no Depression screen [facility] 2/9 03/29/2018 08/22/2017 07/25/2017 01/13/2017 12/14/2016  Decreased Interest 0 0 0 1 1  Down, Depressed, Hopeless 0 0 0 0 1  PHQ - 2 Score 0 0 0 1 2  Altered sleeping 0 1 1 - 0  Tired, decreased energy 0 1 1 - 0  Change in appetite 0 1 0 - 0  Feeling bad or failure about yourself  0 0 0 - 1  Trouble concentrating 0 0 0 - 0  Moving slowly or fidgety/restless 0 0 0 - 0  Suicidal thoughts 0 0 0 - 0  PHQ-9 Score 0 3 - - 3  Difficult doing work/chores Not difficult at all - - - Somewhat difficult   GAD 7 : Generalized Anxiety Score 03/29/2018  Nervous, Anxious, on Edge 1  Control/stop worrying 0  Worry too much - different things 1  Trouble relaxing 1  Restless 0  Easily annoyed or irritable 0  Afraid - awful might happen 0  Total GAD 7 Score 3  Anxiety Difficulty Not difficult at all    Relevant past medical, surgical, family and social history reviewed and updated as indicated. Interim medical history since our last visit reviewed. Allergies and medications reviewed and  updated.  Review of Systems  Constitutional: Negative.   Respiratory: Negative.   Cardiovascular: Negative.   Musculoskeletal: Negative.   Skin: Negative.   Psychiatric/Behavioral: Positive for dysphoric mood. Negative for agitation, behavioral problems, confusion, decreased concentration, hallucinations, self-injury, sleep disturbance and suicidal ideas. The patient is not nervous/anxious and is not hyperactive.     Per HPI unless specifically indicated above     Objective:    BP 109/72 (BP Location: Left Arm, Patient Position: Sitting, Cuff Size: Normal)   Pulse 75   Temp 98.6 F (37 C)   Wt 133 lb 9 oz (60.6 kg)   BMI 25.24 kg/m   Wt Readings from Last 3 Encounters:  03/29/18 133 lb 9 oz (60.6 kg)  08/22/17 132 lb 9.6 oz (60.1 kg)  07/25/17 130 lb (59 kg)    Physical Exam  Constitutional: She is oriented to person, place, and time. She appears well-developed and well-nourished. No distress.  HENT:  Head: Normocephalic and atraumatic.  Right Ear: Hearing normal.  Left Ear: Hearing normal.  Nose: Nose normal.  Eyes: Conjunctivae and lids are normal. Right eye exhibits no discharge. Left eye exhibits no discharge. No scleral icterus.  Cardiovascular: Normal rate, regular rhythm, normal heart sounds and intact distal pulses. Exam reveals no gallop and no friction rub.  No murmur heard. Pulmonary/Chest: Effort normal and breath sounds normal.  No stridor. No respiratory distress. She has no wheezes. She has no rales. She exhibits no tenderness.  Musculoskeletal: Normal range of motion.  Neurological: She is alert and oriented to person, place, and time.  Skin: Skin is warm, dry and intact. Capillary refill takes less than 2 seconds. No rash noted. She is not diaphoretic. No erythema. No pallor.  Psychiatric: She has a normal mood and affect. Her speech is normal and behavior is normal. Judgment and thought content normal. Cognition and memory are normal.  Nursing note and  vitals reviewed.   Results for orders placed or performed in visit on 07/25/17  Microscopic Examination  Result Value Ref Range   WBC, UA 0-5 0 - 5 /hpf   RBC, UA 0-2 0 - 2 /hpf   Epithelial Cells (non renal) >10 (A) 0 - 10 /hpf   Bacteria, UA Few None seen/Few  CBC with Differential/Platelet  Result Value Ref Range   WBC 7.1 3.4 - 10.8 x10E3/uL   RBC 4.56 3.77 - 5.28 x10E6/uL   Hemoglobin 13.4 11.1 - 15.9 g/dL   Hematocrit 16.139.8 09.634.0 - 46.6 %   MCV 87 79 - 97 fL   MCH 29.4 26.6 - 33.0 pg   MCHC 33.7 31.5 - 35.7 g/dL   RDW 04.513.3 40.912.3 - 81.115.4 %   Platelets 273 150 - 379 x10E3/uL   Neutrophils 63 Not Estab. %   Lymphs 29 Not Estab. %   Monocytes 6 Not Estab. %   Eos 1 Not Estab. %   Basos 1 Not Estab. %   Neutrophils Absolute 4.4 1.4 - 7.0 x10E3/uL   Lymphocytes Absolute 2.1 0.7 - 3.1 x10E3/uL   Monocytes Absolute 0.5 0.1 - 0.9 x10E3/uL   EOS (ABSOLUTE) 0.1 0.0 - 0.4 x10E3/uL   Basophils Absolute 0.0 0.0 - 0.2 x10E3/uL   Immature Granulocytes 0 Not Estab. %   Immature Grans (Abs) 0.0 0.0 - 0.1 x10E3/uL  Comprehensive metabolic panel  Result Value Ref Range   Glucose 89 65 - 99 mg/dL   BUN 11 6 - 20 mg/dL   Creatinine, Ser 9.140.77 0.57 - 1.00 mg/dL   GFR calc non Af Amer 103 >59 mL/min/1.73   GFR calc Af Amer 119 >59 mL/min/1.73   BUN/Creatinine Ratio 14 9 - 23   Sodium 137 134 - 144 mmol/L   Potassium 4.4 3.5 - 5.2 mmol/L   Chloride 100 96 - 106 mmol/L   CO2 22 20 - 29 mmol/L   Calcium 9.3 8.7 - 10.2 mg/dL   Total Protein 6.7 6.0 - 8.5 g/dL   Albumin 4.2 3.5 - 5.5 g/dL   Globulin, Total 2.5 1.5 - 4.5 g/dL   Albumin/Globulin Ratio 1.7 1.2 - 2.2   Bilirubin Total 0.3 0.0 - 1.2 mg/dL   Alkaline Phosphatase 45 39 - 117 IU/L   AST 14 0 - 40 IU/L   ALT 15 0 - 32 IU/L  Lipid Panel w/o Chol/HDL Ratio  Result Value Ref Range   Cholesterol, Total 225 (H) 100 - 199 mg/dL   Triglycerides 782125 0 - 149 mg/dL   HDL 70 >95>39 mg/dL   VLDL Cholesterol Cal 25 5 - 40 mg/dL   LDL  Calculated 621130 (H) 0 - 99 mg/dL  TSH  Result Value Ref Range   TSH 1.860 0.450 - 4.500 uIU/mL  UA/M w/rflx Culture, Routine  Result Value Ref Range   Specific Gravity, UA 1.025 1.005 - 1.030   pH, UA 6.0 5.0 - 7.5  Color, UA Yellow Yellow   Appearance Ur Cloudy (A) Clear   Leukocytes, UA Negative Negative   Protein, UA Negative Negative/Trace   Glucose, UA Negative Negative   Ketones, UA Trace (A) Negative   RBC, UA Negative Negative   Bilirubin, UA Negative Negative   Urobilinogen, Ur 0.2 0.2 - 1.0 mg/dL   Nitrite, UA Negative Negative   Microscopic Examination See below:       Assessment & Plan:   Problem List Items Addressed This Visit      Other   Moderate episode of recurrent major depressive disorder (HCC) - Primary    Under good control. Continue current regimen. Continue to monitor. Recheck 6 months. Refills given today.      Relevant Medications   sertraline (ZOLOFT) 50 MG tablet       Follow up plan: Return in about 6 months (around 09/28/2018) for Physical.

## 2018-03-29 NOTE — Assessment & Plan Note (Signed)
Under good control. Continue current regimen. Continue to monitor. Recheck 6 months. Refills given today.

## 2018-03-31 ENCOUNTER — Other Ambulatory Visit: Payer: Self-pay | Admitting: Family Medicine

## 2018-04-02 NOTE — Telephone Encounter (Signed)
Your patient 

## 2018-04-20 ENCOUNTER — Other Ambulatory Visit: Payer: Self-pay | Admitting: Family Medicine

## 2018-04-20 NOTE — Telephone Encounter (Signed)
Your patient 

## 2018-04-28 ENCOUNTER — Other Ambulatory Visit: Payer: Self-pay | Admitting: Family Medicine

## 2018-05-03 DIAGNOSIS — H5213 Myopia, bilateral: Secondary | ICD-10-CM | POA: Diagnosis not present

## 2018-05-03 DIAGNOSIS — H52223 Regular astigmatism, bilateral: Secondary | ICD-10-CM | POA: Diagnosis not present

## 2018-05-31 ENCOUNTER — Ambulatory Visit: Payer: 59 | Admitting: Family Medicine

## 2018-05-31 DIAGNOSIS — Z3201 Encounter for pregnancy test, result positive: Secondary | ICD-10-CM | POA: Diagnosis not present

## 2018-06-26 DIAGNOSIS — Z3201 Encounter for pregnancy test, result positive: Secondary | ICD-10-CM | POA: Diagnosis not present

## 2018-06-27 ENCOUNTER — Other Ambulatory Visit: Payer: Self-pay | Admitting: Family Medicine

## 2018-06-27 NOTE — Telephone Encounter (Signed)
Zoloft 50 mg refill Last Refill:02/15/18  Last OV: 03/29/18 PCP: Laural Benes [phone]

## 2018-06-28 MED ORDER — SERTRALINE HCL 50 MG PO TABS: 50.0000 mg | ORAL_TABLET | Freq: Every day | ORAL | 1 refills | Status: DC

## 2018-06-28 NOTE — Telephone Encounter (Signed)
Already had refills, but re-sent in case it wasn't received

## 2018-07-09 LAB — OB RESULTS CONSOLE HIV ANTIBODY (ROUTINE TESTING): HIV: NONREACTIVE

## 2018-07-09 LAB — OB RESULTS CONSOLE ANTIBODY SCREEN: Antibody Screen: NEGATIVE

## 2018-07-09 LAB — OB RESULTS CONSOLE RPR: RPR: NONREACTIVE

## 2018-07-09 LAB — OB RESULTS CONSOLE ABO/RH: RH Type: POSITIVE

## 2018-07-09 LAB — OB RESULTS CONSOLE RUBELLA ANTIBODY, IGM: Rubella: IMMUNE

## 2018-07-09 LAB — OB RESULTS CONSOLE HEPATITIS B SURFACE ANTIGEN: Hepatitis B Surface Ag: NEGATIVE

## 2018-07-09 LAB — OB RESULTS CONSOLE GC/CHLAMYDIA
Chlamydia: NEGATIVE
Gonorrhea: NEGATIVE

## 2018-07-12 DIAGNOSIS — Z3401 Encounter for supervision of normal first pregnancy, first trimester: Secondary | ICD-10-CM | POA: Diagnosis not present

## 2018-07-12 DIAGNOSIS — Z118 Encounter for screening for other infectious and parasitic diseases: Secondary | ICD-10-CM | POA: Diagnosis not present

## 2018-08-16 DIAGNOSIS — Z3402 Encounter for supervision of normal first pregnancy, second trimester: Secondary | ICD-10-CM | POA: Diagnosis not present

## 2018-09-11 DIAGNOSIS — Z3A19 19 weeks gestation of pregnancy: Secondary | ICD-10-CM | POA: Diagnosis not present

## 2018-09-11 DIAGNOSIS — O358XX0 Maternal care for other (suspected) fetal abnormality and damage, not applicable or unspecified: Secondary | ICD-10-CM | POA: Diagnosis not present

## 2018-10-05 DIAGNOSIS — Z3A22 22 weeks gestation of pregnancy: Secondary | ICD-10-CM | POA: Diagnosis not present

## 2018-10-05 DIAGNOSIS — O358XX0 Maternal care for other (suspected) fetal abnormality and damage, not applicable or unspecified: Secondary | ICD-10-CM | POA: Diagnosis not present

## 2018-11-07 DIAGNOSIS — Z3A27 27 weeks gestation of pregnancy: Secondary | ICD-10-CM | POA: Diagnosis not present

## 2018-11-07 DIAGNOSIS — O99810 Abnormal glucose complicating pregnancy: Secondary | ICD-10-CM | POA: Diagnosis not present

## 2018-11-15 DIAGNOSIS — Z23 Encounter for immunization: Secondary | ICD-10-CM | POA: Diagnosis not present

## 2018-11-21 ENCOUNTER — Encounter: Payer: 59 | Attending: Obstetrics | Admitting: Registered"

## 2018-11-21 DIAGNOSIS — O99810 Abnormal glucose complicating pregnancy: Secondary | ICD-10-CM

## 2018-11-22 ENCOUNTER — Encounter: Payer: Self-pay | Admitting: Registered"

## 2018-11-22 DIAGNOSIS — O99810 Abnormal glucose complicating pregnancy: Secondary | ICD-10-CM | POA: Insufficient documentation

## 2018-11-22 NOTE — Progress Notes (Signed)
Patient was seen on 11/21/18 for Gestational Diabetes self-management class at the [facility]. The following learning objectives were met by the patient during this course:   States the definition of Gestational Diabetes  States why dietary management is important in controlling blood glucose  Describes the effects each nutrient has on blood glucose levels  Demonstrates ability to create a balanced meal plan  Demonstrates carbohydrate counting   States when to check blood glucose levels  Demonstrates proper blood glucose monitoring techniques  States the effect of stress and exercise on blood glucose levels  States the importance of limiting caffeine and abstaining from alcohol and smoking  Blood glucose monitor given: none  Patient instructed to monitor glucose levels: FBS: 60 - <95; 1 hour: <140; 2 hour: <120  Patient received handouts:  Nutrition Diabetes and Pregnancy, including carb counting list  Patient will be seen for follow-up as needed.

## 2018-12-06 DIAGNOSIS — O26893 Other specified pregnancy related conditions, third trimester: Secondary | ICD-10-CM | POA: Diagnosis not present

## 2018-12-06 DIAGNOSIS — R51 Headache: Secondary | ICD-10-CM | POA: Diagnosis not present

## 2018-12-06 DIAGNOSIS — Z3A31 31 weeks gestation of pregnancy: Secondary | ICD-10-CM | POA: Diagnosis not present

## 2018-12-07 DIAGNOSIS — H47333 Pseudopapilledema of optic disc, bilateral: Secondary | ICD-10-CM | POA: Diagnosis not present

## 2018-12-07 DIAGNOSIS — R51 Headache: Secondary | ICD-10-CM | POA: Diagnosis not present

## 2018-12-20 DIAGNOSIS — O24410 Gestational diabetes mellitus in pregnancy, diet controlled: Secondary | ICD-10-CM | POA: Diagnosis not present

## 2018-12-20 DIAGNOSIS — Z3A33 33 weeks gestation of pregnancy: Secondary | ICD-10-CM | POA: Diagnosis not present

## 2018-12-20 DIAGNOSIS — Z3483 Encounter for supervision of other normal pregnancy, third trimester: Secondary | ICD-10-CM | POA: Diagnosis not present

## 2018-12-20 DIAGNOSIS — Z3482 Encounter for supervision of other normal pregnancy, second trimester: Secondary | ICD-10-CM | POA: Diagnosis not present

## 2019-01-04 DIAGNOSIS — R030 Elevated blood-pressure reading, without diagnosis of hypertension: Secondary | ICD-10-CM | POA: Diagnosis not present

## 2019-01-07 DIAGNOSIS — O133 Gestational [pregnancy-induced] hypertension without significant proteinuria, third trimester: Secondary | ICD-10-CM | POA: Diagnosis not present

## 2019-01-07 DIAGNOSIS — Z3685 Encounter for antenatal screening for Streptococcus B: Secondary | ICD-10-CM | POA: Diagnosis not present

## 2019-01-07 DIAGNOSIS — O24410 Gestational diabetes mellitus in pregnancy, diet controlled: Secondary | ICD-10-CM | POA: Diagnosis not present

## 2019-01-07 DIAGNOSIS — Z3A35 35 weeks gestation of pregnancy: Secondary | ICD-10-CM | POA: Diagnosis not present

## 2019-01-07 LAB — OB RESULTS CONSOLE GBS: GBS: NEGATIVE

## 2019-01-08 ENCOUNTER — Telehealth (HOSPITAL_COMMUNITY): Payer: Self-pay | Admitting: *Deleted

## 2019-01-08 ENCOUNTER — Encounter (HOSPITAL_COMMUNITY): Payer: Self-pay | Admitting: *Deleted

## 2019-01-08 ENCOUNTER — Other Ambulatory Visit: Payer: Self-pay | Admitting: Obstetrics and Gynecology

## 2019-01-08 NOTE — Telephone Encounter (Signed)
Preadmission screen  

## 2019-01-10 ENCOUNTER — Encounter (HOSPITAL_COMMUNITY): Payer: Self-pay | Admitting: *Deleted

## 2019-01-10 ENCOUNTER — Telehealth (HOSPITAL_COMMUNITY): Payer: Self-pay | Admitting: *Deleted

## 2019-01-10 NOTE — Telephone Encounter (Signed)
Preadmission screen  

## 2019-01-14 ENCOUNTER — Inpatient Hospital Stay (EMERGENCY_DEPARTMENT_HOSPITAL)
Admission: AD | Admit: 2019-01-14 | Discharge: 2019-01-14 | Disposition: A | Discharge status: Home / Self Care | Payer: 59 | Source: Ambulatory Visit | Attending: Obstetrics and Gynecology | Admitting: Obstetrics and Gynecology

## 2019-01-14 ENCOUNTER — Other Ambulatory Visit: Payer: Self-pay | Admitting: Obstetrics and Gynecology

## 2019-01-14 ENCOUNTER — Other Ambulatory Visit: Payer: Self-pay

## 2019-01-14 ENCOUNTER — Encounter (HOSPITAL_COMMUNITY): Payer: Self-pay | Admitting: *Deleted

## 2019-01-14 DIAGNOSIS — O133 Gestational [pregnancy-induced] hypertension without significant proteinuria, third trimester: Secondary | ICD-10-CM | POA: Insufficient documentation

## 2019-01-14 DIAGNOSIS — O134 Gestational [pregnancy-induced] hypertension without significant proteinuria, complicating childbirth: Secondary | ICD-10-CM | POA: Diagnosis not present

## 2019-01-14 DIAGNOSIS — Z3A36 36 weeks gestation of pregnancy: Secondary | ICD-10-CM

## 2019-01-14 DIAGNOSIS — O24419 Gestational diabetes mellitus in pregnancy, unspecified control: Secondary | ICD-10-CM | POA: Insufficient documentation

## 2019-01-14 DIAGNOSIS — O1404 Mild to moderate pre-eclampsia, complicating childbirth: Secondary | ICD-10-CM | POA: Diagnosis not present

## 2019-01-14 HISTORY — DX: Unspecified convulsions: R56.9

## 2019-01-14 HISTORY — DX: Gestational (pregnancy-induced) hypertension without significant proteinuria, unspecified trimester: O13.9

## 2019-01-14 LAB — COMPREHENSIVE METABOLIC PANEL
ALT: 12 U/L (ref 0–44)
AST: 20 U/L (ref 15–41)
Albumin: 2.4 g/dL — ABNORMAL LOW (ref 3.5–5.0)
Alkaline Phosphatase: 189 U/L — ABNORMAL HIGH (ref 38–126)
Anion gap: 9 (ref 5–15)
BUN: 15 mg/dL (ref 6–20)
CO2: 20 mmol/L — ABNORMAL LOW (ref 22–32)
Calcium: 8.7 mg/dL — ABNORMAL LOW (ref 8.9–10.3)
Chloride: 107 mmol/L (ref 98–111)
Creatinine, Ser: 0.59 mg/dL (ref 0.44–1.00)
GFR calc Af Amer: >60 mL/min (ref >60)
GFR calc non Af Amer: >60 mL/min (ref >60)
Glucose, Bld: 93 mg/dL (ref 70–99)
Potassium: 4 mmol/L (ref 3.5–5.1)
Sodium: 136 mmol/L (ref 135–145)
Total Bilirubin: 0.6 mg/dL (ref 0.3–1.2)
Total Protein: 5.3 g/dL — ABNORMAL LOW (ref 6.5–8.1)

## 2019-01-14 LAB — CBC
HCT: 33.8 % — ABNORMAL LOW (ref 36.0–46.0)
Hemoglobin: 11.2 g/dL — ABNORMAL LOW (ref 12.0–15.0)
MCH: 26 pg (ref 26.0–34.0)
MCHC: 33.1 g/dL (ref 30.0–36.0)
MCV: 78.6 fL — ABNORMAL LOW (ref 80.0–100.0)
Platelets: 169 10*3/uL (ref 150–400)
RBC: 4.3 MIL/uL (ref 3.87–5.11)
RDW: 14.1 % (ref 11.5–15.5)
WBC: 8.1 10*3/uL (ref 4.0–10.5)
nRBC: 0 % (ref 0.0–0.2)

## 2019-01-14 LAB — PROTEIN / CREATININE RATIO, URINE
Creatinine, Urine: 102.96 mg/dL
Protein Creatinine Ratio: 0.26 mg/mg{Cre} — ABNORMAL HIGH (ref 0.00–0.15)
Total Protein, Urine: 27 mg/dL

## 2019-01-14 NOTE — MAU Provider Note (Signed)
Chief Complaint  Patient presents with  . Hypertension     First Provider Initiated Contact with Patient 01/14/19 1255     S: Rachel [name]  is a 33 y.o. y.o. year old G7P0010 female at [redacted]w[redacted]d weeks gestation who presents to MAU from the office for elevated blood pressures. She has been diagnosed with GDM and GHTN during this pregnancy, scheduled for IOL on 4/15. Reports elevated BP over this weekend of 151/105 and 147/103. Took BP this morning which was 141/92, denies HA , vision changes or RUQ pain. +FM. Occasional contractions that are not painful. Denies vaginal bleeding or LOF.   O:  Patient Vitals for the past 24 hrs:  BP Temp Temp src Pulse Resp SpO2 Height Weight  01/14/19 1431 118/68 - - 74 - - - -  01/14/19 1416 106/72 - - 83 - - - -  01/14/19 1401 117/72 - - 74 - - - -  01/14/19 1346 115/67 - - 79 - - - -  01/14/19 1331 110/67 - - 83 - - - -  01/14/19 1316 108/73 - - 91 - - - -  01/14/19 1301 129/87 - - (!) 120 - - - -  01/14/19 1256 (!) 138/95 - - 97 - - - -  01/14/19 1232 (!) 129/92 98.5 F (36.9 C) Oral 91 17 99 % 5' (1.524 m) 75.7 kg   General: NAD Heart: Regular rate Lungs: Normal rate and effort Abd: Soft, NT, Gravid, S=D Extremities: Trace Pedal edema Neuro: 2+ deep tendon reflexes, No clonus Pelvic: deferred   FHR: 140/ moderate/ +accels/ one late deceleration (vagal response after having blood drawn), no decelerations after  Toco: UI with occasional mild contractions  Results for orders placed or performed during the hospital encounter of 01/14/19 (from the past 24 hour(s))  Protein / creatinine ratio, urine     Status: Abnormal   Collection Time: 01/14/19 12:51 PM  Result Value Ref Range   Creatinine, Urine 102.96 mg/dL   Total Protein, Urine 27 mg/dL   Protein Creatinine Ratio 0.26 (H) 0.00 - 0.15 mg/mg[Cre]  CBC     Status: Abnormal   Collection Time: 01/14/19  1:05 PM  Result Value Ref Range   WBC 8.1 4.0 - 10.5 K/uL   RBC 4.30 3.87 - 5.11 MIL/uL    Hemoglobin 11.2 (L) 12.0 - 15.0 g/dL   HCT 28.7 (L) 86.7 - 67.2 %   MCV 78.6 (L) 80.0 - 100.0 fL   MCH 26.0 26.0 - 34.0 pg   MCHC 33.1 30.0 - 36.0 g/dL   RDW 09.4 70.9 - 62.8 %   Platelets 169 150 - 400 K/uL   nRBC 0.0 0.0 - 0.2 %  Comprehensive metabolic panel     Status: Abnormal   Collection Time: 01/14/19  1:05 PM  Result Value Ref Range   Sodium 136 135 - 145 mmol/L   Potassium 4.0 3.5 - 5.1 mmol/L   Chloride 107 98 - 111 mmol/L   CO2 20 (L) 22 - 32 mmol/L   Glucose, Bld 93 70 - 99 mg/dL   BUN 15 6 - 20 mg/dL   Creatinine, Ser 3.66 0.44 - 1.00 mg/dL   Calcium 8.7 (L) 8.9 - 10.3 mg/dL   Total Protein 5.3 (L) 6.5 - 8.1 g/dL   Albumin 2.4 (L) 3.5 - 5.0 g/dL   AST 20 15 - 41 U/L   ALT 12 0 - 44 U/L   Alkaline Phosphatase 189 (H) 38 - 126 U/L  Total Bilirubin 0.6 0.3 - 1.2 mg/dL   GFR calc non Af Amer >60 >60 mL/min   GFR calc Af Amer >60 >60 mL/min   Anion gap 9 5 - 15    A: 8285w6d week IUP Gestational hypertension FHR reactive  P: Discharge home in stable condition Preeclampsia precautions. Labor precautions  Follow up as scheduled for IOL on 4/15 Return to maternity admissions as needed in emergencies   Sharyon CableRogers, [name], CNM 01/14/2019 2:36 PM

## 2019-01-14 NOTE — Discharge Instructions (Signed)
Hypertension During Pregnancy ° °Hypertension is also called high blood pressure. High blood pressure means that the force of your blood moving in your body is too strong. When you are pregnant, this condition should be watched carefully. It can cause problems for you and your baby. °Follow these instructions at home: °Eating and drinking ° °· Drink enough fluid to keep your pee (urine) pale yellow. °· Avoid caffeine. °Lifestyle °· Do not use any products that contain nicotine or tobacco, such as cigarettes and e-cigarettes. If you need help quitting, ask your doctor. °· Do not use alcohol or drugs. °· Avoid stress. °· Rest and get plenty of sleep. °General instructions °· Take over-the-counter and prescription medicines only as told by your doctor. °· While lying down, lie on your left side. This keeps pressure off your major blood vessels. °· While sitting or lying down, raise (elevate) your feet. Try putting some pillows under your lower legs. °· Exercise regularly. Ask your doctor what kinds of exercise are best for you. °· Keep all prenatal and follow-up visits as told by your doctor. This is important. °Contact a doctor if: °· You have symptoms that your doctor told you to watch for, such as: °? Throwing up (vomiting). °? Feeling sick to your stomach (nausea). °? Headache. °Get help right away if you have: °· Very bad belly pain that does not get better with treatment. °· A very bad headache that does not get better. °· Throwing up that does not get better with treatment. °· Sudden, fast weight gain. °· Sudden swelling in your hands, ankles, or face. °· Bleeding from your vagina. °· Blood in your pee. °· Fewer movements from your baby than usual. °· Blurry vision. °· Double vision. °· Muscle twitching. °· Sudden muscle tightening (spasms). °· Trouble breathing. °· Blue fingernails or lips. °Summary °· Hypertension is also called high blood pressure. High blood pressure means that the force of your blood moving  in your body is too strong. °· When you are pregnant, this condition should be watched carefully. It can cause problems for you and your baby. °· Get help right away if you have symptoms that your doctor told you to watch for. °This information is not intended to replace advice given to you by your health care provider. Make sure you discuss any questions you have with your health care provider. °Document Released: 10/22/2010 Document Revised: 09/05/2017 Document Reviewed: 05/31/2016 °Elsevier Interactive Patient Education © 2019 [employer] ° °

## 2019-01-14 NOTE — MAU Note (Signed)
Had high BP over the weekend, last night and this morning.  Is scheduled for induction tomorrow, (gest dm and gest HTN).  Denies HA, visual changes, epigastric pain or increase in swelling. Denies bleeding or leaking.  No pain other then normal preg stuff

## 2019-01-15 ENCOUNTER — Other Ambulatory Visit (HOSPITAL_COMMUNITY): Payer: Self-pay | Admitting: *Deleted

## 2019-01-16 ENCOUNTER — Inpatient Hospital Stay (HOSPITAL_COMMUNITY): Payer: 59 | Admitting: Anesthesiology

## 2019-01-16 ENCOUNTER — Other Ambulatory Visit: Payer: Self-pay

## 2019-01-16 ENCOUNTER — Inpatient Hospital Stay (HOSPITAL_COMMUNITY): Payer: 59

## 2019-01-16 ENCOUNTER — Inpatient Hospital Stay (HOSPITAL_COMMUNITY)
Admission: AD | Admit: 2019-01-16 | Discharge: 2019-01-18 | DRG: 787 | Disposition: A | Discharge status: Home / Self Care | Payer: 59 | Attending: Obstetrics and Gynecology | Admitting: Obstetrics and Gynecology

## 2019-01-16 ENCOUNTER — Encounter (HOSPITAL_COMMUNITY): Payer: Self-pay

## 2019-01-16 ENCOUNTER — Encounter (HOSPITAL_COMMUNITY)
Admission: AD | Disposition: A | Discharge status: Home / Self Care | Payer: Self-pay | Source: Home / Self Care | Attending: Obstetrics and Gynecology

## 2019-01-16 DIAGNOSIS — F419 Anxiety disorder, unspecified: Secondary | ICD-10-CM | POA: Diagnosis present

## 2019-01-16 DIAGNOSIS — K219 Gastro-esophageal reflux disease without esophagitis: Secondary | ICD-10-CM | POA: Diagnosis present

## 2019-01-16 DIAGNOSIS — O99344 Other mental disorders complicating childbirth: Secondary | ICD-10-CM | POA: Diagnosis present

## 2019-01-16 DIAGNOSIS — Z87891 Personal history of nicotine dependence: Secondary | ICD-10-CM

## 2019-01-16 DIAGNOSIS — F329 Major depressive disorder, single episode, unspecified: Secondary | ICD-10-CM | POA: Diagnosis present

## 2019-01-16 DIAGNOSIS — Z3490 Encounter for supervision of normal pregnancy, unspecified, unspecified trimester: Secondary | ICD-10-CM | POA: Diagnosis present

## 2019-01-16 DIAGNOSIS — Z3A37 37 weeks gestation of pregnancy: Secondary | ICD-10-CM | POA: Diagnosis not present

## 2019-01-16 DIAGNOSIS — Z8673 Personal history of transient ischemic attack (TIA), and cerebral infarction without residual deficits: Secondary | ICD-10-CM

## 2019-01-16 DIAGNOSIS — O9962 Diseases of the digestive system complicating childbirth: Secondary | ICD-10-CM | POA: Diagnosis present

## 2019-01-16 DIAGNOSIS — O24420 Gestational diabetes mellitus in childbirth, diet controlled: Secondary | ICD-10-CM | POA: Diagnosis present

## 2019-01-16 DIAGNOSIS — D500 Iron deficiency anemia secondary to blood loss (chronic): Secondary | ICD-10-CM | POA: Diagnosis not present

## 2019-01-16 DIAGNOSIS — O1404 Mild to moderate pre-eclampsia, complicating childbirth: Principal | ICD-10-CM | POA: Diagnosis present

## 2019-01-16 DIAGNOSIS — O134 Gestational [pregnancy-induced] hypertension without significant proteinuria, complicating childbirth: Secondary | ICD-10-CM | POA: Diagnosis not present

## 2019-01-16 DIAGNOSIS — O9081 Anemia of the puerperium: Secondary | ICD-10-CM | POA: Diagnosis not present

## 2019-01-16 DIAGNOSIS — Z98891 History of uterine scar from previous surgery: Secondary | ICD-10-CM

## 2019-01-16 DIAGNOSIS — O24429 Gestational diabetes mellitus in childbirth, unspecified control: Secondary | ICD-10-CM | POA: Diagnosis not present

## 2019-01-16 DIAGNOSIS — D62 Acute posthemorrhagic anemia: Secondary | ICD-10-CM | POA: Diagnosis not present

## 2019-01-16 DIAGNOSIS — Z3A00 Weeks of gestation of pregnancy not specified: Secondary | ICD-10-CM | POA: Diagnosis not present

## 2019-01-16 LAB — GLUCOSE, CAPILLARY
Glucose-Capillary: 112 mg/dL — ABNORMAL HIGH (ref 70–99)
Glucose-Capillary: 60 mg/dL — ABNORMAL LOW (ref 70–99)
Glucose-Capillary: 74 mg/dL (ref 70–99)
Glucose-Capillary: 89 mg/dL (ref 70–99)
Glucose-Capillary: 91 mg/dL (ref 70–99)
Glucose-Capillary: 98 mg/dL (ref 70–99)

## 2019-01-16 LAB — COMPREHENSIVE METABOLIC PANEL
ALT: 12 U/L (ref 0–44)
AST: 21 U/L (ref 15–41)
Albumin: 2.5 g/dL — ABNORMAL LOW (ref 3.5–5.0)
Alkaline Phosphatase: 209 U/L — ABNORMAL HIGH (ref 38–126)
Anion gap: 9 (ref 5–15)
BUN: 11 mg/dL (ref 6–20)
CO2: 21 mmol/L — ABNORMAL LOW (ref 22–32)
Calcium: 9 mg/dL (ref 8.9–10.3)
Chloride: 105 mmol/L (ref 98–111)
Creatinine, Ser: 0.63 mg/dL (ref 0.44–1.00)
GFR calc Af Amer: >60 mL/min (ref >60)
GFR calc non Af Amer: >60 mL/min (ref >60)
Glucose, Bld: 89 mg/dL (ref 70–99)
Potassium: 4 mmol/L (ref 3.5–5.1)
Sodium: 135 mmol/L (ref 135–145)
Total Bilirubin: 0.5 mg/dL (ref 0.3–1.2)
Total Protein: 5.1 g/dL — ABNORMAL LOW (ref 6.5–8.1)

## 2019-01-16 LAB — PROTEIN / CREATININE RATIO, URINE
Creatinine, Urine: 72.77 mg/dL
Protein Creatinine Ratio: 0.88 mg/mg{Cre} — ABNORMAL HIGH (ref 0.00–0.15)
Total Protein, Urine: 64 mg/dL

## 2019-01-16 LAB — CBC
HCT: 33.6 % — ABNORMAL LOW (ref 36.0–46.0)
HCT: 37.7 % (ref 36.0–46.0)
Hemoglobin: 11.3 g/dL — ABNORMAL LOW (ref 12.0–15.0)
Hemoglobin: 12.1 g/dL (ref 12.0–15.0)
MCH: 26.2 pg (ref 26.0–34.0)
MCH: 25.2 pg — ABNORMAL LOW (ref 26.0–34.0)
MCHC: 33.6 g/dL (ref 30.0–36.0)
MCHC: 32.1 g/dL (ref 30.0–36.0)
MCV: 78 fL — ABNORMAL LOW (ref 80.0–100.0)
MCV: 78.4 fL — ABNORMAL LOW (ref 80.0–100.0)
Platelets: 162 10*3/uL (ref 150–400)
Platelets: 160 10*3/uL (ref 150–400)
RBC: 4.31 MIL/uL (ref 3.87–5.11)
RBC: 4.81 MIL/uL (ref 3.87–5.11)
RDW: 14.1 % (ref 11.5–15.5)
RDW: 13.9 % (ref 11.5–15.5)
WBC: 9.8 10*3/uL (ref 4.0–10.5)
WBC: 8 10*3/uL (ref 4.0–10.5)
nRBC: 0 % (ref 0.0–0.2)
nRBC: 0 % (ref 0.0–0.2)

## 2019-01-16 LAB — ABO/RH: ABO/RH(D): A POS

## 2019-01-16 LAB — TYPE AND SCREEN
ABO/RH(D): A POS
Antibody Screen: NEGATIVE

## 2019-01-16 SURGERY — Surgical Case
Anesthesia: Epidural | Site: Abdomen | Wound class: Clean Contaminated

## 2019-01-16 MED ORDER — GENTAMICIN SULFATE 40 MG/ML IJ SOLN
5.0000 mg/kg | Freq: Once | INTRAVENOUS | Status: AC
  Administered 2019-01-16: 381.6 mg via INTRAVENOUS
  Filled 2019-01-16: qty 9.5

## 2019-01-16 MED ORDER — NALBUPHINE HCL 10 MG/ML IJ SOLN: 5.0000 mg | Freq: Once | INTRAMUSCULAR | Status: DC | PRN

## 2019-01-16 MED ORDER — NALBUPHINE HCL 10 MG/ML IJ SOLN: 5.0000 mg | INTRAMUSCULAR | Status: DC | PRN

## 2019-01-16 MED ORDER — LACTATED RINGERS IV SOLN: 500.0000 mL | INTRAVENOUS | Status: DC | PRN

## 2019-01-16 MED ORDER — LACTATED RINGERS IV SOLN
INTRAVENOUS | Status: DC
  Administered 2019-01-16 (×2): via INTRAVENOUS

## 2019-01-16 MED ORDER — LIDOCAINE HCL (PF) 1 % IJ SOLN
INTRAMUSCULAR | Status: DC | PRN
  Administered 2019-01-16 (×2): 6 mL via EPIDURAL

## 2019-01-16 MED ORDER — DIPHENHYDRAMINE HCL 25 MG PO CAPS: 25.0000 mg | ORAL_CAPSULE | ORAL | Status: DC | PRN

## 2019-01-16 MED ORDER — TERBUTALINE SULFATE 1 MG/ML IJ SOLN
0.2500 mg | Freq: Once | INTRAMUSCULAR | Status: AC | PRN
  Administered 2019-01-16: 16:00:00 0.25 mg via SUBCUTANEOUS
  Filled 2019-01-16: qty 1

## 2019-01-16 MED ORDER — FENTANYL CITRATE (PF) 100 MCG/2ML IJ SOLN
INTRAMUSCULAR | Status: DC | PRN
  Administered 2019-01-16: 25 ug via INTRAVENOUS
  Administered 2019-01-16: 50 ug via INTRAVENOUS
  Administered 2019-01-16: 25 ug via INTRAVENOUS

## 2019-01-16 MED ORDER — WITCH HAZEL-GLYCERIN EX PADS: 1.0000 "application " | MEDICATED_PAD | CUTANEOUS | Status: DC | PRN

## 2019-01-16 MED ORDER — ONDANSETRON HCL 4 MG/2ML IJ SOLN
INTRAMUSCULAR | Status: DC | PRN
  Administered 2019-01-16: 4 mg via INTRAVENOUS

## 2019-01-16 MED ORDER — NALOXONE HCL 4 MG/10ML IJ SOLN
1.0000 ug/kg/h | INTRAVENOUS | Status: DC | PRN
  Filled 2019-01-16: qty 5

## 2019-01-16 MED ORDER — KETOROLAC TROMETHAMINE 30 MG/ML IJ SOLN
30.0000 mg | Freq: Four times a day (QID) | INTRAMUSCULAR | Status: AC
  Administered 2019-01-17 (×4): 30 mg via INTRAVENOUS
  Filled 2019-01-16 (×4): qty 1

## 2019-01-16 MED ORDER — ACETAMINOPHEN 325 MG PO TABS: 650.0000 mg | ORAL_TABLET | ORAL | Status: DC | PRN

## 2019-01-16 MED ORDER — FENTANYL-BUPIVACAINE-NACL 0.5-0.125-0.9 MG/250ML-% EP SOLN
EPIDURAL | Status: AC
  Filled 2019-01-16: qty 250

## 2019-01-16 MED ORDER — ONDANSETRON HCL 4 MG/2ML IJ SOLN: 4.0000 mg | Freq: Four times a day (QID) | INTRAMUSCULAR | Status: DC | PRN

## 2019-01-16 MED ORDER — DIBUCAINE (PERIANAL) 1 % EX OINT: 1.0000 "application " | TOPICAL_OINTMENT | CUTANEOUS | Status: DC | PRN

## 2019-01-16 MED ORDER — IBUPROFEN 800 MG PO TABS
800.0000 mg | ORAL_TABLET | Freq: Four times a day (QID) | ORAL | Status: DC
  Administered 2019-01-18 (×4): 800 mg via ORAL
  Filled 2019-01-16 (×4): qty 1

## 2019-01-16 MED ORDER — SIMETHICONE 80 MG PO CHEW: 80.0000 mg | CHEWABLE_TABLET | ORAL | Status: DC | PRN

## 2019-01-16 MED ORDER — SOD CITRATE-CITRIC ACID 500-334 MG/5ML PO SOLN
30.0000 mL | ORAL | Status: DC | PRN
  Administered 2019-01-16: 19:00:00 30 mL via ORAL
  Filled 2019-01-16: qty 15

## 2019-01-16 MED ORDER — OXYTOCIN 40 UNITS IN NORMAL SALINE INFUSION - SIMPLE MED
1.0000 m[IU]/min | INTRAVENOUS | Status: DC
  Administered 2019-01-16: 900 mL via INTRAVENOUS
  Administered 2019-01-16 (×2): 2 m[IU]/min via INTRAVENOUS
  Filled 2019-01-16: qty 1000

## 2019-01-16 MED ORDER — ONDANSETRON HCL 4 MG/2ML IJ SOLN
INTRAMUSCULAR | Status: AC
  Filled 2019-01-16: qty 2

## 2019-01-16 MED ORDER — OXYTOCIN 40 UNITS IN NORMAL SALINE INFUSION - SIMPLE MED: 2.5000 [IU]/h | INTRAVENOUS | Status: AC

## 2019-01-16 MED ORDER — DEXAMETHASONE SODIUM PHOSPHATE 10 MG/ML IJ SOLN
INTRAMUSCULAR | Status: DC | PRN
  Administered 2019-01-16: 5 mg via INTRAVENOUS

## 2019-01-16 MED ORDER — PHENYLEPHRINE 40 MCG/ML (10ML) SYRINGE FOR IV PUSH (FOR BLOOD PRESSURE SUPPORT)
PREFILLED_SYRINGE | INTRAVENOUS | Status: AC
  Administered 2019-01-16: 16:00:00 80 ug
  Filled 2019-01-16: qty 10

## 2019-01-16 MED ORDER — DIPHENHYDRAMINE HCL 50 MG/ML IJ SOLN: 12.5000 mg | INTRAMUSCULAR | Status: DC | PRN

## 2019-01-16 MED ORDER — ONDANSETRON HCL 4 MG/2ML IJ SOLN: 4.0000 mg | Freq: Three times a day (TID) | INTRAMUSCULAR | Status: DC | PRN

## 2019-01-16 MED ORDER — KETOROLAC TROMETHAMINE 30 MG/ML IJ SOLN
INTRAMUSCULAR | Status: AC
  Filled 2019-01-16: qty 1

## 2019-01-16 MED ORDER — PHENYLEPHRINE 40 MCG/ML (10ML) SYRINGE FOR IV PUSH (FOR BLOOD PRESSURE SUPPORT)
PREFILLED_SYRINGE | INTRAVENOUS | Status: DC | PRN
  Administered 2019-01-16 (×4): 80 ug via INTRAVENOUS

## 2019-01-16 MED ORDER — MISOPROSTOL 25 MCG QUARTER TABLET
25.0000 ug | ORAL_TABLET | ORAL | Status: AC | PRN
  Administered 2019-01-16 (×2): 25 ug via VAGINAL
  Filled 2019-01-16 (×2): qty 1

## 2019-01-16 MED ORDER — FENTANYL CITRATE (PF) 100 MCG/2ML IJ SOLN: 25.0000 ug | INTRAMUSCULAR | Status: DC | PRN

## 2019-01-16 MED ORDER — KETOROLAC TROMETHAMINE 30 MG/ML IJ SOLN
30.0000 mg | Freq: Once | INTRAMUSCULAR | Status: AC | PRN
  Administered 2019-01-16: 30 mg via INTRAVENOUS

## 2019-01-16 MED ORDER — SODIUM CHLORIDE (PF) 0.9 % IJ SOLN
INTRAMUSCULAR | Status: DC | PRN
  Administered 2019-01-16: 12 mL/h via EPIDURAL

## 2019-01-16 MED ORDER — DIPHENHYDRAMINE HCL 25 MG PO CAPS: 25.0000 mg | ORAL_CAPSULE | Freq: Four times a day (QID) | ORAL | Status: DC | PRN

## 2019-01-16 MED ORDER — SCOPOLAMINE 1 MG/3DAYS TD PT72
MEDICATED_PATCH | TRANSDERMAL | Status: AC
  Filled 2019-01-16: qty 1

## 2019-01-16 MED ORDER — TETANUS-DIPHTH-ACELL PERTUSSIS 5-2.5-18.5 LF-MCG/0.5 IM SUSP: 0.5000 mL | Freq: Once | INTRAMUSCULAR | Status: DC

## 2019-01-16 MED ORDER — SCOPOLAMINE 1 MG/3DAYS TD PT72
1.0000 | MEDICATED_PATCH | Freq: Once | TRANSDERMAL | Status: DC
  Administered 2019-01-16: 21:00:00 1.5 mg via TRANSDERMAL

## 2019-01-16 MED ORDER — SODIUM CHLORIDE 0.9 % IR SOLN
Status: DC | PRN
  Administered 2019-01-16: 1

## 2019-01-16 MED ORDER — OXYTOCIN 40 UNITS IN NORMAL SALINE INFUSION - SIMPLE MED
INTRAVENOUS | Status: AC
  Filled 2019-01-16: qty 2000

## 2019-01-16 MED ORDER — OXYCODONE-ACETAMINOPHEN 5-325 MG PO TABS: 1.0000 | ORAL_TABLET | ORAL | Status: DC | PRN

## 2019-01-16 MED ORDER — MENTHOL 3 MG MT LOZG: 1.0000 | LOZENGE | OROMUCOSAL | Status: DC | PRN

## 2019-01-16 MED ORDER — FENTANYL CITRATE (PF) 100 MCG/2ML IJ SOLN
INTRAMUSCULAR | Status: AC
  Filled 2019-01-16: qty 2

## 2019-01-16 MED ORDER — SENNOSIDES-DOCUSATE SODIUM 8.6-50 MG PO TABS
2.0000 | ORAL_TABLET | ORAL | Status: DC
  Administered 2019-01-17 – 2019-01-18 (×2): 2 via ORAL
  Filled 2019-01-16 (×2): qty 2

## 2019-01-16 MED ORDER — CLINDAMYCIN PHOSPHATE 900 MG/50ML IV SOLN
INTRAVENOUS | Status: DC | PRN
  Administered 2019-01-16: 900 mg via INTRAVENOUS

## 2019-01-16 MED ORDER — OXYTOCIN BOLUS FROM INFUSION: 500.0000 mL | Freq: Once | INTRAVENOUS | Status: DC

## 2019-01-16 MED ORDER — LACTATED RINGERS IV SOLN
INTRAVENOUS | Status: DC
  Administered 2019-01-17: 08:00:00 via INTRAVENOUS

## 2019-01-16 MED ORDER — OXYCODONE HCL 5 MG PO TABS: 5.0000 mg | ORAL_TABLET | Freq: Once | ORAL | Status: DC | PRN

## 2019-01-16 MED ORDER — SERTRALINE HCL 50 MG PO TABS
50.0000 mg | ORAL_TABLET | Freq: Every day | ORAL | Status: DC
  Administered 2019-01-17: 50 mg via ORAL
  Filled 2019-01-16: qty 1

## 2019-01-16 MED ORDER — COCONUT OIL OIL: 1.0000 "application " | TOPICAL_OIL | Status: DC | PRN

## 2019-01-16 MED ORDER — NALOXONE HCL 0.4 MG/ML IJ SOLN: 0.4000 mg | INTRAMUSCULAR | Status: DC | PRN

## 2019-01-16 MED ORDER — ONDANSETRON HCL 4 MG/2ML IJ SOLN: 4.0000 mg | Freq: Once | INTRAMUSCULAR | Status: DC | PRN

## 2019-01-16 MED ORDER — DEXAMETHASONE SODIUM PHOSPHATE 10 MG/ML IJ SOLN
INTRAMUSCULAR | Status: AC
  Filled 2019-01-16: qty 1

## 2019-01-16 MED ORDER — KETOROLAC TROMETHAMINE 30 MG/ML IJ SOLN
INTRAMUSCULAR | Status: AC
  Filled 2019-01-16: qty 2

## 2019-01-16 MED ORDER — MEPERIDINE HCL 25 MG/ML IJ SOLN: 6.2500 mg | INTRAMUSCULAR | Status: DC | PRN

## 2019-01-16 MED ORDER — LIDOCAINE HCL (PF) 1 % IJ SOLN
30.0000 mL | INTRAMUSCULAR | Status: DC | PRN
  Filled 2019-01-16: qty 30

## 2019-01-16 MED ORDER — OXYCODONE HCL 5 MG/5ML PO SOLN: 5.0000 mg | Freq: Once | ORAL | Status: DC | PRN

## 2019-01-16 MED ORDER — SODIUM CHLORIDE 0.9% FLUSH: 3.0000 mL | INTRAVENOUS | Status: DC | PRN

## 2019-01-16 MED ORDER — NALBUPHINE HCL 10 MG/ML IJ SOLN
10.0000 mg | INTRAMUSCULAR | Status: DC | PRN
  Administered 2019-01-16: 10 mg via INTRAVENOUS
  Filled 2019-01-16: qty 1

## 2019-01-16 MED ORDER — PRENATAL MULTIVITAMIN CH
1.0000 | ORAL_TABLET | Freq: Every day | ORAL | Status: DC
  Administered 2019-01-17 – 2019-01-18 (×2): 1 via ORAL
  Filled 2019-01-16 (×2): qty 1

## 2019-01-16 MED ORDER — CLINDAMYCIN PHOSPHATE 900 MG/50ML IV SOLN
INTRAVENOUS | Status: AC
  Filled 2019-01-16: qty 50

## 2019-01-16 MED ORDER — OXYTOCIN 40 UNITS IN NORMAL SALINE INFUSION - SIMPLE MED: 2.5000 [IU]/h | INTRAVENOUS | Status: DC

## 2019-01-16 MED ORDER — SIMETHICONE 80 MG PO CHEW
80.0000 mg | CHEWABLE_TABLET | ORAL | Status: DC
  Administered 2019-01-17 – 2019-01-18 (×2): 80 mg via ORAL
  Filled 2019-01-16 (×2): qty 1

## 2019-01-16 MED ORDER — SIMETHICONE 80 MG PO CHEW
80.0000 mg | CHEWABLE_TABLET | Freq: Three times a day (TID) | ORAL | Status: DC
  Administered 2019-01-17 – 2019-01-18 (×5): 80 mg via ORAL
  Filled 2019-01-16 (×5): qty 1

## 2019-01-16 MED ORDER — LIDOCAINE-EPINEPHRINE (PF) 2 %-1:200000 IJ SOLN
INTRAMUSCULAR | Status: DC | PRN
  Administered 2019-01-16 (×3): 5 mL via EPIDURAL

## 2019-01-16 SURGICAL SUPPLY — 35 items
BENZOIN TINCTURE PRP APPL 2/3 (GAUZE/BANDAGES/DRESSINGS) ×2 IMPLANT
CHLORAPREP W/TINT 26ML (MISCELLANEOUS) ×2 IMPLANT
CLAMP CORD UMBIL (MISCELLANEOUS) IMPLANT
CLOTH BEACON ORANGE TIMEOUT ST (SAFETY) ×2 IMPLANT
DRSG OPSITE POSTOP 4X10 (GAUZE/BANDAGES/DRESSINGS) ×2 IMPLANT
ELECT REM PT RETURN 9FT ADLT (ELECTROSURGICAL) ×2
ELECTRODE REM PT RTRN 9FT ADLT (ELECTROSURGICAL) ×1 IMPLANT
EXTRACTOR VACUUM KIWI (MISCELLANEOUS) ×2 IMPLANT
GLOVE BIOGEL PI IND STRL 6.5 (GLOVE) ×1 IMPLANT
GLOVE BIOGEL PI IND STRL 7.0 (GLOVE) ×2 IMPLANT
GLOVE BIOGEL PI INDICATOR 6.5 (GLOVE) ×1
GLOVE BIOGEL PI INDICATOR 7.0 (GLOVE) ×2
GLOVE ECLIPSE 6.5 STRL STRAW (GLOVE) ×2 IMPLANT
GOWN STRL REUS W/TWL LRG LVL3 (GOWN DISPOSABLE) ×4 IMPLANT
KIT ABG SYR 3ML LUER SLIP (SYRINGE) IMPLANT
NEEDLE HYPO 25X5/8 SAFETYGLIDE (NEEDLE) IMPLANT
NS IRRIG 1000ML POUR BTL (IV SOLUTION) ×2 IMPLANT
PACK C SECTION WH (CUSTOM PROCEDURE TRAY) ×2 IMPLANT
PAD OB MATERNITY 4.3X12.25 (PERSONAL CARE ITEMS) ×2 IMPLANT
PENCIL SMOKE EVAC W/HOLSTER (ELECTROSURGICAL) ×2 IMPLANT
RETRACTOR WND ALEXIS 25 LRG (MISCELLANEOUS) IMPLANT
RTRCTR WOUND ALEXIS 25CM LRG (MISCELLANEOUS)
STRIP CLOSURE SKIN 1/2X4 (GAUZE/BANDAGES/DRESSINGS) ×2 IMPLANT
SUT MNCRL 0 VIOLET CTX 36 (SUTURE) ×2 IMPLANT
SUT MONOCRYL 0 CTX 36 (SUTURE) ×2
SUT PDS AB 0 CTX 36 PDP370T (SUTURE) IMPLANT
SUT PLAIN 2 0 XLH (SUTURE) IMPLANT
SUT VIC AB 0 CT1 36 (SUTURE) IMPLANT
SUT VIC AB 3-0 CT1 27 (SUTURE)
SUT VIC AB 3-0 CT1 TAPERPNT 27 (SUTURE) IMPLANT
SUT VIC AB 4-0 KS 27 (SUTURE) ×2 IMPLANT
SUT VIC AB 4-0 PS2 27 (SUTURE) ×2 IMPLANT
TOWEL OR 17X24 6PK STRL BLUE (TOWEL DISPOSABLE) ×2 IMPLANT
TRAY FOLEY W/BAG SLVR 14FR LF (SET/KITS/TRAYS/PACK) IMPLANT
WATER STERILE IRR 1000ML POUR (IV SOLUTION) ×2 IMPLANT

## 2019-01-16 NOTE — Progress Notes (Signed)
  Called by nursing re: new onset late decelerations after restarting pitocin  Pt comfortable  BP 128/67   Pulse (!) 102   Temp 98.8 F (37.1 C) (Oral)   Resp 16   Ht 5' (1.524 m)   Wt 76.3 kg   SpO2 97%   BMI 32.85 kg/m   A&ox3 nml respirations Abd: soft, nt, gravid LE; +1, nt bilated  Fht: 140s, nml variability; +accels but recurrent late decelerations after most contractions; pitocin stopped and contractions spacing  A/p: iup at 37.1 wga 1. nrfht remote from delivery/fetal intolerance to labor - reviewed the late decelerations and that pitocin stopped; fetal intolerance to labor and recommendation to proceed with c/s for delivery; this is now the 3rd time pitocin has been stopped; pt and husband agreeable to proceed; reviewed risk including but not limited to bleeding, infection, injury to bowel/bladder/nerves/blood vessels, risk of blood transfusion, risk of anesthesia, risk of blood clot to lungs/legs; consent signed

## 2019-01-16 NOTE — Progress Notes (Signed)
Patient ID: Rachel [name], female   DOB: [birth year], 33 y.o.   MRN: [id number] 33 yo G1 at 37 wks here for IOL for GHTN without preeclampsia. NO symptoms. S/p Cytotec x 2 doses, last at 5.15 am.  Ate light labor diet  BP 125/90   Pulse 72   Temp 98.7 F (37.1 C) (Oral)   Resp 16   Ht 5' (1.524 m)   Wt 76.3 kg   SpO2 97%   BMI 32.85 kg/m    FHT 145--150s/ + accels no decels, mod variab- cat I Toco - irreg  SVE Cx short <1 cm, soft but not dilated. Station Vx unengaged, above inlet   Overnight Cytotec and will start pitocin at 9.30 am as any more cytotec wont benefit. Could not place cervical foley, may attempt again after pitocin. Ambulate, use birthing ball to aid engagement  Rachel [name],Rachel Bobier, MD

## 2019-01-16 NOTE — Anesthesia Preprocedure Evaluation (Signed)
Anesthesia Evaluation  Patient identified by MRN, date of birth, ID band Patient awake    Reviewed: Allergy & Precautions, H&P , NPO status , Patient's Chart, lab work & pertinent test results  Airway Mallampati: II  TM Distance: >3 FB Neck ROM: full    Dental no notable dental hx. (+) Teeth Intact   Pulmonary neg pulmonary ROS, former smoker,    Pulmonary exam normal breath sounds clear to auscultation       Cardiovascular hypertension, negative cardio ROS Normal cardiovascular exam Rhythm:regular Rate:Normal     Neuro/Psych PSYCHIATRIC DISORDERS Anxiety Depression    GI/Hepatic negative GI ROS, Neg liver ROS,   Endo/Other  diabetes, Gestational  Renal/GU negative Renal ROS  negative genitourinary   Musculoskeletal negative musculoskeletal ROS (+)   Abdominal (+) + obese,   Peds  Hematology negative hematology ROS (+)   Anesthesia Other Findings   Reproductive/Obstetrics (+) Pregnancy                             Anesthesia Physical Anesthesia Plan  ASA: II  Anesthesia Plan: Epidural   Post-op Pain Management:    Induction:   PONV Risk Score and Plan:   Airway Management Planned:   Additional Equipment:   Intra-op Plan:   Post-operative Plan:   Informed Consent: I have reviewed the patients History and Physical, chart, labs and discussed the procedure including the risks, benefits and alternatives for the proposed anesthesia with the patient or authorized representative who has indicated his/her understanding and acceptance.       Plan Discussed with:   Anesthesia Plan Comments:         Anesthesia Quick Evaluation

## 2019-01-16 NOTE — Progress Notes (Signed)
Pt just received epidural and comfortable Walked into room during decels and nursing having done cervical exam and position change  Temp:  [98.4 F (36.9 C)-98.8 F (37.1 C)] 98.7 F (37.1 C) (04/15 0855) Pulse Rate:  [61-106] 106 (04/15 1616) Resp:  [16-18] 16 (04/15 1400) BP: (103-139)/(65-101) 115/67 (04/15 1616) SpO2:  [97 %] 97 % (04/15 0120) Weight:  [76.3 kg] 76.3 kg (04/15 0020)   A&ox3 nml respirations Abd: soft, nt, gravid Cx: 3/80/-3, cephalic; fse placed  Fht: 140s, normal variability then repetitive late and prolonged late decelerations after epidural; longest lasted 4 min (x2) with drop to 60, repetitive - pitocin stopped and terbutaline given; decels then resolved and back to baseline with normal variability; contractions about 1-2 min and now about q 3-4 and more mild  A/p: iup at 37.1 wga 1. IOL - suspect likely d/t hypotension after epidural triggered most recent episode of late decels but has had prior late decels (occ); will monitor closely but suspect placental issue as well; I have reviewed with patient and husband that will first allow fetus to rest then will begin pitocin; if late decels begain again will likely proceed with c/s unless delivery is imminent; pt agrees and says that her biggest anxiety is over having a svd, ok with c/s if needed 2. Fetal status cat 2 and obs closely  3. ghtn - labs wnl; will watch closely, now low normal after epidrual

## 2019-01-16 NOTE — H&P (Addendum)
CALLIE DESMIDT is a 33 y.o. G2P0010 at [redacted]w[redacted]d gestation presents for IOL for GHTN.  Denies h/a, vision changes, ruq pain, getting uncomfortable with contractions Antepartum course: gestational hypertension; anxiety - zoloft; thickened NT - normal anatomy otherwise, declined fetal echo, declined further testing; GDMA1 - nml bs, s>d, efw at 33 wga 87% (5'12") , abp/hc>90% [facility] at [facility] since 10 wks.  See complete pre-natal records  History OB History    Gravida  2   Para      Term      Preterm      AB  1   Living        SAB      TAB      Ectopic      Multiple      Live Births             Past Medical History:  Diagnosis Date  . Anxiety   . 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 Depression    medicated, doing well  . Elevated transaminase level   . Gestational diabetes   . History of alcoholism (HCC)   . History of substance abuse (HCC)   . 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 Osteoporosis   . Pregnancy induced hypertension   . Seizures (HCC)    from alcohol, has been sober for over 2yrs   Past Surgical History:  Procedure Laterality Date  . KNEE ARTHROSCOPY Bilateral   . TONSILLECTOMY     Family History: family history includes Alcohol abuse in her father and mother; COPD in her paternal grandfather; Cancer in her mother; Depression in her mother; Diabetes in her paternal grandfather; Drug abuse in her mother; Heart attack in her mother; Heart disease in her mother. Social History:  reports that she quit smoking about 2 years ago. She quit after 15.00 years of use. She has never used smokeless tobacco. She reports that she does not drink alcohol or use drugs.  ROS: See above otherwise negative  Prenatal labs:  ABO, Rh: --/--/A POS, A POS (04/15 0045) Antibody: NEG (04/15 0045) Rubella: Immune (10/07 0000) RPR: Nonreactive (10/07 0000)  HBsAg: Negative (10/07 0000)  HIV:Non-reactive (10/07 0000)  GBS:    1 hr  Glucola: Normal Genetic screening: Normal NT screen, NT thickened;  Anatomy US: thickened nuchal fold, otherwise anatomy wnl  Physical Exam:   Dilation: Closed Effacement (%): 50 Station: Ballotable Exam by:: Mody Blood pressure 119/78, pulse 71, temperature 98.7 F (37.1 C), temperature source Oral, resp. rate 18, height 5' (1.524 m), weight 76.3 kg, SpO2 97 %. A&O x 3 HEENT: Normal Lungs: CTAB CV: RRR Abdominal: Soft, Non-tender, Gravid and Estimated fetal weight: 7- 1/2 lbs lbs  Lower Extremities: Edematous, +1 pitting edema, Non-tender  Pelvic Exam:      Dilatation: 1cm (1.5cm)     Effacement: 70%     Station: -3     Presentation: Cephalic; balloon catheter placed but then fell out with contraction before inflated; decision to not place d/t soft cx and effacement arom with moderate amount clear fluid Labs:  CBC:  Lab Results  Component Value Date   WBC 9.8 01/16/2019   RBC 4.31 01/16/2019   HGB 11.3 (L) 01/16/2019   HCT 33.6 (L) 01/16/2019   MCV 78.0 (L) 01/16/2019   MCH 26.2 01/16/2019   MCHC 33.6 01/16/2019   RDW 14.1 01/16/2019   PLT 162 01/16/2019   CMP:  Lab Results  Component Value Date   NA 136 01/14/2019   K 4.0 01/14/2019   CL 107 01/14/2019   CO2 20 (L) 01/14/2019   GLUCOSE 93 01/14/2019   BUN 15 01/14/2019   CREATININE 0.59 01/14/2019   CALCIUM 8.7 (L) 01/14/2019   PROT 5.3 (L) 01/14/2019   AST 20 01/14/2019   ALT 12 01/14/2019   ALBUMIN 2.4 (L) 01/14/2019   ALKPHOS 189 (H) 01/14/2019   BILITOT 0.6 01/14/2019   GFRNONAA >60 01/14/2019   GFRAA >60 01/14/2019   ANIONGAP 9 01/14/2019   Urine: Lab Results  Component Value Date   COLORURINE Yellow 07/30/2013   APPEARANCEUR Cloudy (A) 07/25/2017   LABSPEC 1.014 07/30/2013   PHURINE 6.0 07/30/2013   GLUCOSEU Negative 07/25/2017   HGBUR Negative 07/30/2013   BILIRUBINUR Negative 07/25/2017   KETONESUR Negative 07/30/2013   PROTEINUR Negative 07/25/2017   NITRITE Negative 07/25/2017    LEUKOCYTESUR Negative 07/25/2017     Prenatal Transfer Tool  Maternal Diabetes: Yes:  Diabetes Type:  Diet controlled Genetic Screening: Abnormal:  Results: Other: normal screen, thickened NT; declined further testing Maternal Ultrasounds/Referrals: Abnormal:  Findings:   Other: thickened nuchal fold Fetal Ultrasounds or other Referrals:  Other: declined fetal echo Maternal Substance Abuse:  no Significant Maternal Medications:  Meds include: Zoloft Significant Maternal Lab Results: Lab values include: Group B Strep negative  FHT: 130s, nml variability; +accels, previously occ late decel but resolved and cat 1 now Toco: q 1-3   Assessment/Plan:  33 y.o. G2P0010 at 5120w1d gestation   1. GHTN - nml bps today, asymptomatic; follow closely; labs pr/cr ratio wnl 2 days ago, repeat now 2. Fetal status reassuring - pitocin had been decreased but now after >1hr of cat 1 strip contin to increase 3. IOL: s/p cytotec x2 and now pitocin; contin pitocin and now arom; plans epidural; plan svd 4. Gdma1, controlled 5. Abnormal NT/nuchal fold, nml screen otherwise, declined further testing, declined fetal echo, nml cardiac views with anatomy u/s 6. gbs negative 7. Gerd: protonix  8. Anxiety/depression - zoloft  Vick FreesSusan E Darolyn Double 01/16/2019, 1:18 PM

## 2019-01-16 NOTE — Transfer of Care (Signed)
Immediate Anesthesia Transfer of Care Note  Patient: Rachel [name]  Procedure(s) Performed: CESAREAN SECTION (N/A Abdomen)  Patient Location: PACU  Anesthesia Type:Epidural  Level of Consciousness: awake, alert  and oriented  Airway & Oxygen Therapy: Patient Spontanous Breathing  Post-op Assessment: Report given to RN and Post -op Vital signs reviewed and stable  Post vital signs: Reviewed and stable  Last Vitals:  Vitals Value Taken Time  BP 113/88 01/16/2019  8:15 PM  Temp 36.6 C 01/16/2019  8:05 PM  Pulse 109 01/16/2019  8:11 PM  Resp 20 01/16/2019  8:17 PM  SpO2 99 % 01/16/2019  8:11 PM  Vitals shown include unvalidated device data.  Last Pain:  Vitals:   01/16/19 2009  TempSrc:   PainSc: 4          Complications: No apparent anesthesia complications

## 2019-01-16 NOTE — Progress Notes (Signed)
Patient ID: [name], female   DOB: [birth year], 33 y.o.   MRN: [id number] 33 yo G1 at 37 wks here for IOL for GHTN without preeclampsia. NO symptoms.  BP 136/81   Pulse 75   Temp 98.4 F (36.9 C) (Oral)   Resp 18   Ht 5' (1.524 m)   Wt 76.3 kg   SpO2 97%   BMI 32.85 kg/m    FHT 145--150s/ + accels no decels, mod variab- cat I Toco - irreg  SVE def.   Overnight Cytotec and will start pitocin this AM after 4hrs from 2nd.  Ok to ambulate, shower and light labor diet since UCs are mild.   Hilary Hertz, MD

## 2019-01-16 NOTE — Anesthesia Procedure Notes (Signed)
Epidural Patient location during procedure: OB Start time: 01/16/2019 3:38 PM End time: 01/16/2019 3:41 PM  Staffing Anesthesiologist: Leilani Able, MD Performed: anesthesiologist   Preanesthetic Checklist Completed: patient identified, site marked, surgical consent, pre-op evaluation, timeout performed, IV checked, risks and benefits discussed and monitors and equipment checked  Epidural Patient position: sitting Prep: site prepped and draped and DuraPrep Patient monitoring: continuous pulse ox and blood pressure Approach: midline Location: L3-L4 Injection technique: LOR air  Needle:  Needle type: Tuohy  Needle gauge: 17 G Needle length: 9 cm and 9 Needle insertion depth: 7 cm Catheter type: closed end flexible Catheter size: 19 Gauge Catheter at skin depth: 12 cm Test dose: negative and Other  Assessment Sensory level: T9 Events: blood not aspirated, injection not painful, no injection resistance, negative IV test and no paresthesia  Additional Notes Reason for block:procedure for pain

## 2019-01-16 NOTE — Op Note (Signed)
Rachel [name]  01/16/2019  Indications: Fetal Distress   Pre-operative Diagnosis: Primary Rachel section; non-reassuring fetal heart rate, gdma, mild pre-e Procedure: 1ltcs Post-operative Diagnosis: Same   Surgeon: Surgeon(s) and Role:    * Lavontay Kirk, Candace Gallus, MD - Primary   Assistants: Marlinda Mike, CNM  Anesthesia: epidural   Procedure Details:  The patient was seen in the labor Room. The risks, benefits, complications, treatment options, and expected outcomes were discussed with the patient. The patient concurred with the proposed plan, giving informed consent. identified as [name] and the procedure verified as C-Section Delivery. A Time Out was held and the above information confirmed.  After induction of anesthesia, the patient was draped and prepped in the usual sterile manner, foley was draining urine well.  A pfannenstiel incision was made and carried down through the subcutaneous tissue to the fascia. Fascial incision was made and extended transversely. The fascia was separated from the underlying rectus tissue superiorly and inferiorly. The peritoneum was identified and entered. Peritoneal incision was extended longitudinally. A bladder blade was then placed. The utero-vesical peritoneal reflection was incised transversely and the bladder flap was bluntly freed from the lower uterine segment. The bladder blade was then replaced.  A low transverse uterine incision was made. Delivered from cephalic presentation was a viable female infant with vigorous cry. Apgar scores of 8 at one minute and 9 at five minutes. Delayed cord clamping done at 1 minute and baby handed to NICU team in attendance. Cord ph was not sent. Cord blood was obtained for evaluation. The placenta was removed Intact and spontaneously and appeared normal. The uterus was then cleared of clot and debris.  The uterine outline, tubes and ovaries appeared normal}. The uterine incision  was closed with running locked sutures of . A second imbricating layer sutured.   Hemostasis was observed. . The uterus was then placed back into the abdomen and the incision remainted hemostatic.  Continued hemostasis was noted.  The fascia was then reapproximated with running sutures of 0Vicryl. The subcutaneous layer was hemostatic with bovie.  The skin was closed with 4-0Vicryl.   Instrument, sponge, and needle counts were correct prior the abdominal closure and were correct at the conclusion of the case.    Findings: viable female infant, normal tubes/ovaries, uterus   Estimated Blood Loss: 400 mL   Total IV Fluids:   Urine Output: 200CC OF clear urine  Specimens: placenta to pathology   Complications: no complications  Disposition: PACU - hemodynamically stable.   Maternal Condition: stable   Baby condition / location:  Couplet care / Skin to Skin  Attending Attestation: I performed the procedure.   Signed: Surgeon(s): Amado Nash Candace Gallus, MD

## 2019-01-17 ENCOUNTER — Encounter (HOSPITAL_COMMUNITY): Payer: Self-pay | Admitting: Obstetrics and Gynecology

## 2019-01-17 LAB — GLUCOSE, CAPILLARY: Glucose-Capillary: 84 mg/dL (ref 70–99)

## 2019-01-17 LAB — CBC
HCT: 30.8 % — ABNORMAL LOW (ref 36.0–46.0)
Hemoglobin: 10.1 g/dL — ABNORMAL LOW (ref 12.0–15.0)
MCH: 25.9 pg — ABNORMAL LOW (ref 26.0–34.0)
MCHC: 32.8 g/dL (ref 30.0–36.0)
MCV: 79 fL — ABNORMAL LOW (ref 80.0–100.0)
Platelets: 155 10*3/uL (ref 150–400)
RBC: 3.9 MIL/uL (ref 3.87–5.11)
RDW: 14.1 % (ref 11.5–15.5)
WBC: 18.1 10*3/uL — ABNORMAL HIGH (ref 4.0–10.5)
nRBC: 0 % (ref 0.0–0.2)

## 2019-01-17 MED ORDER — ACETAMINOPHEN 500 MG PO TABS
1000.0000 mg | ORAL_TABLET | Freq: Four times a day (QID) | ORAL | Status: DC | PRN
  Administered 2019-01-17 (×2): 1000 mg via ORAL
  Filled 2019-01-17 (×2): qty 2

## 2019-01-17 MED ORDER — SERTRALINE HCL 50 MG PO TABS
75.0000 mg | ORAL_TABLET | Freq: Every day | ORAL | Status: DC
  Administered 2019-01-18: 75 mg via ORAL
  Filled 2019-01-17: qty 1

## 2019-01-17 MED ORDER — OXYCODONE HCL 5 MG PO TABS: 5.0000 mg | ORAL_TABLET | ORAL | Status: DC | PRN

## 2019-01-17 NOTE — Progress Notes (Signed)
MOB was referred for history of depression/anxiety. * Referral screened out by Clinical Social Worker because none of the following criteria appear to apply: ~ History of anxiety/depression during this pregnancy, or of post-partum depression following prior delivery. ~ Diagnosis of anxiety and/or depression within last 3 years OR * MOB's symptoms currently being treated with medication and/or therapy. Per MOB's H&P, MOB on Zoloft currently 75mg daily, increased during pregnancy)  Please contact the Clinical Social Worker if needs arise, by MOB request, or if MOB scores greater than 9/yes to question 10 on Edinburgh Postpartum Depression Screen.      Pollie Poma S. Kincade Granberg, MSW, LCSW-A [facility]  [phone] 

## 2019-01-17 NOTE — Lactation Note (Signed)
This note was copied from a baby's chart. Lactation Consultation Note Baby 10 hrs old. Baby had a normal glucose then a low glucose, then 40. Hand expressed colostrum given, placed baby STS. Baby very jittery. Baby 37 1/7 weeks New mom took a class, read on line about BF. Mom wants to BF, will be working from home.  Mom has tubular breast w/everted nipples. Colostrum collected to give to baby as supplement. Newborn behavior, STS, I&O, breast massage, cluster feeding, supply and demand discussed. Mom states baby has had several good BF. Is sleepy at this time. Baby having some gagging and burping.  Mom demonstrated hand expression to RN.  Discussed how STS is good for baby's glucose. As well as hand expression and spoon feeding. Answered questions. Encouraged mom to call for assistance or further questions. Lactation brochure at bedside.  Patient Name: Rachel [name] [id number] Date: 01/17/2019 Reason for consult: Initial assessment;1st time breastfeeding;Early term 37-38.6wks   Maternal Data Has patient been taught Hand Expression?: Yes Does the patient have breastfeeding experience prior to this delivery?: No  Feeding Feeding Type: Breast Milk  LATCH Score Latch: Too sleepy or reluctant, no latch achieved, no sucking elicited.  Audible Swallowing: None  Type of Nipple: Everted at rest and after stimulation  Comfort (Breast/Nipple): Soft / non-tender  Hold (Positioning): Assistance needed to correctly position infant at breast and maintain latch.  LATCH Score: 5  Interventions Interventions: Breast feeding basics reviewed;Breast compression;Skin to skin;Support pillows;Breast massage;Position options;Expressed milk;Hand express  Lactation Tools Discussed/Used WIC Program: No   Consult Status Consult Status: Follow-up Date: 01/17/19 Follow-up type: In-patient    Lavontae Cornia, Diamond Nickel 01/17/2019, 6:05 AM

## 2019-01-17 NOTE — Lactation Note (Signed)
This note was copied from a baby's chart. Lactation Consultation Note  Patient Name: Rachel [name] Date: 01/17/2019 Reason for consult: Follow-up assessment Baby is 18 hours old/2% weight loss.  Mom reports that feeding are improving.  She is feeling more comfortable with positioning and has had help with feedings.  Mom knows to expect some cluster feeding on day 2.  Instructed to feed with cues and call for assist prn.  Maternal Data    Feeding Feeding Type: Breast Fed  LATCH Score Latch: Repeated attempts needed to sustain latch, nipple held in mouth throughout feeding, stimulation needed to elicit sucking reflex.  Audible Swallowing: A few with stimulation  Type of Nipple: Everted at rest and after stimulation  Comfort (Breast/Nipple): Soft / non-tender  Hold (Positioning): Assistance needed to correctly position infant at breast and maintain latch.  LATCH Score: 7  Interventions    Lactation Tools Discussed/Used     Consult Status Consult Status: Follow-up Date: 01/18/19 Follow-up type: In-patient    Huston Foley 01/17/2019, 2:18 PM

## 2019-01-17 NOTE — Progress Notes (Addendum)
POSTOPERATIVE DAY # 1 S/P Primary LTCS for non-reassuring fetal heart rate, baby boy "Fayrene FearingJames"  S:         Reports feeling overall well, having some incisional pain/soreness  Reports HA last night due to not eating; none today, denies visual changes, RUQ/epigastric pain  Emotionally feeling well and stable; reports good support working through symptoms with her husband              Tolerating po intake / no nausea / no vomiting / + flatus / no BM  Denies dizziness, SOB, or CP             Bleeding is light             Pain controlled with Toradol; would like to avoid narcotics if possible             Has been up twice without problems /foley catheter in place  Newborn breast feeding - going well per patient, has worked with lactation and has been hand expressing colostrum and finger feeding baby / Circumcision - planning today by Dr. Amado NashAlmquist    O:  VS: BP 125/68   Pulse 81   Temp 97.9 F (36.6 C) (Oral)   Resp 18   Ht 5' (1.524 m)   Wt 76.3 kg   SpO2 99%   BMI 32.85 kg/m  01/17/19 0634  97.9 F (36.6 C)  81  -  -  125/68  -  99 %  -  - KB   01/17/19 0200  97.7 F (36.5 C)  92  -  -  125/68  Semi-fowlers  98 %  -  - KB   01/17/19 0030  98.3 F (36.8 C)  75  -  -  135/78  -  99 %  -  - KB   01/16/19 2300  98.1 F (36.7 C)  97  -  -  133/77  -  99 %  -  - KB   01/16/19 2135  98.4 F (36.9 C)  103Abnormal   -  18  129/85  -  99 %  -  - KB   01/16/19 2112  97.8 F (36.6 C)  -  100  18  132/86  -  100 %  Room Air  - OW   01/16/19 2045  -  -  100  17  125/85  -  100 %  Room Air  - OW      LABS:               Results for orders placed or performed during the hospital encounter of 01/16/19 (from the past 24 hour(s))  Glucose, capillary     Status: Abnormal   Collection Time: 01/16/19 11:15 AM  Result Value Ref Range   Glucose-Capillary 60 (L) 70 - 99 mg/dL  Glucose, capillary     Status: None   Collection Time: 01/16/19 12:21 PM  Result Value Ref Range   Glucose-Capillary 98 70 -  99 mg/dL  Comprehensive metabolic panel     Status: Abnormal   Collection Time: 01/16/19  3:15 PM  Result Value Ref Range   Sodium 135 135 - 145 mmol/L   Potassium 4.0 3.5 - 5.1 mmol/L   Chloride 105 98 - 111 mmol/L   CO2 21 (L) 22 - 32 mmol/L   Glucose, Bld 89 70 - 99 mg/dL   BUN 11 6 - 20 mg/dL   Creatinine, Ser 1.610.63 0.44 - 1.00 mg/dL  Calcium 9.0 8.9 - 10.3 mg/dL   Total Protein 5.1 (L) 6.5 - 8.1 g/dL   Albumin 2.5 (L) 3.5 - 5.0 g/dL   AST 21 15 - 41 U/L   ALT 12 0 - 44 U/L   Alkaline Phosphatase 209 (H) 38 - 126 U/L   Total Bilirubin 0.5 0.3 - 1.2 mg/dL   GFR calc non Af Amer >60 >60 mL/min   GFR calc Af Amer >60 >60 mL/min   Anion gap 9 5 - 15  CBC     Status: Abnormal   Collection Time: 01/16/19  3:15 PM  Result Value Ref Range   WBC 8.0 4.0 - 10.5 K/uL   RBC 4.81 3.87 - 5.11 MIL/uL   Hemoglobin 12.1 12.0 - 15.0 g/dL   HCT 28.3 15.1 - 76.1 %   MCV 78.4 (L) 80.0 - 100.0 fL   MCH 25.2 (L) 26.0 - 34.0 pg   MCHC 32.1 30.0 - 36.0 g/dL   RDW 60.7 37.1 - 06.2 %   Platelets 160 150 - 400 K/uL   nRBC 0.0 0.0 - 0.2 %  Protein / creatinine ratio, urine     Status: Abnormal   Collection Time: 01/16/19  4:30 PM  Result Value Ref Range   Creatinine, Urine 72.77 mg/dL   Total Protein, Urine 64 mg/dL   Protein Creatinine Ratio 0.88 (H) 0.00 - 0.15 mg/mg[Cre]  Glucose, capillary     Status: None   Collection Time: 01/16/19  6:38 PM  Result Value Ref Range   Glucose-Capillary 89 70 - 99 mg/dL  Glucose, capillary     Status: Abnormal   Collection Time: 01/16/19  8:20 PM  Result Value Ref Range   Glucose-Capillary 112 (H) 70 - 99 mg/dL  CBC     Status: Abnormal   Collection Time: 01/17/19  6:59 AM  Result Value Ref Range   WBC 18.1 (H) 4.0 - 10.5 K/uL   RBC 3.90 3.87 - 5.11 MIL/uL   Hemoglobin 10.1 (L) 12.0 - 15.0 g/dL   HCT 69.4 (L) 85.4 - 62.7 %   MCV 79.0 (L) 80.0 - 100.0 fL   MCH 25.9 (L) 26.0 - 34.0 pg   MCHC 32.8 30.0 - 36.0 g/dL   RDW 03.5 00.9 - 38.1 %    Platelets 155 150 - 400 K/uL   nRBC 0.0 0.0 - 0.2 %  Glucose, capillary     Status: None   Collection Time: 01/17/19  8:26 AM  Result Value Ref Range   Glucose-Capillary 84 70 - 99 mg/dL               Bloodtype: --/--/A POS, A POS (04/15 0045)  Rubella: Immune (10/07 0000)                                             I&O: Intake/Output      04/15 0701 - 04/16 0700 04/16 0701 - 04/17 0700   I.V. (mL/kg) 500 (6.6)    Total Intake(mL/kg) 500 (6.6)    Urine (mL/kg/hr) 1225 (0.7)    Blood 400    Total Output 1625    Net -1125                      Physical Exam:             Alert and Oriented X3  Lungs: Clear and unlabored  Heart: regular rate and rhythm / no murmurs  Abdomen: soft, non-tender, mild distention, active bowel sounds in all quadrants              Fundus: firm, non-tender, U-E             Dressing: honeycomb with steri-strips c/d/i              Incision:  approximated with sutures / no erythema / no ecchymosis / no drainage  Perineum: intact; labial edema noted  Lochia: small, no clots   Extremities: +1 LE edema, no calf pain or tenderness, no clonus bilaterally, +2 DTRS bilaterally   A/P:     POD # 1 S/P Primary LTCS  Mild pre-eclampsia    - BPs WNL   - Labs stable    - No neural s/s             A1GDM   - stable, delivered  Anxiety and depression    - on Zoloft  daily (only  was ordered and she received it this morning, so patient is going to take her own  tablet today, then she will take  here tomorrow AM)   - Watch closely for s/s of PPD/anxiety   GERD    - was on Protonix during pregnancy   Mild ABL Anemia    - Plan to start oral FE at discharge   Routine postoperative care              See lactation today   Encouraged to rest when baby rests  Warm liquids and ambulation to promote bowel motility   D/C IVF now - tolerating PO and has edema at baseline from [facility]  D/C foley catheter  May shower this afternoon  Lactation support  Wants to  avoid narcotics; add Tylenol  PO every 6 hours PRN for pain; Oxycodone IR 5-10mg  every 4 hours PRN for severe pain   Continue inpatient care   Carlean Jews, MSN, CNM [facility]

## 2019-01-18 ENCOUNTER — Encounter (HOSPITAL_COMMUNITY): Payer: Self-pay | Admitting: *Deleted

## 2019-01-18 DIAGNOSIS — D500 Iron deficiency anemia secondary to blood loss (chronic): Secondary | ICD-10-CM | POA: Diagnosis not present

## 2019-01-18 LAB — RPR: RPR Ser Ql: NONREACTIVE

## 2019-01-18 MED ORDER — TRAMADOL HCL 50 MG PO TABS: 50.0000 mg | ORAL_TABLET | Freq: Four times a day (QID) | ORAL | 0 refills | Status: AC

## 2019-01-18 MED ORDER — TRAMADOL HCL 50 MG PO TABS
50.0000 mg | ORAL_TABLET | Freq: Four times a day (QID) | ORAL | Status: DC
  Administered 2019-01-18 (×2): 50 mg via ORAL
  Filled 2019-01-18 (×2): qty 1

## 2019-01-18 MED ORDER — IBUPROFEN 800 MG PO TABS: 800.0000 mg | ORAL_TABLET | Freq: Four times a day (QID) | ORAL | 0 refills | Status: DC

## 2019-01-18 NOTE — Discharge Summary (Signed)
OB Discharge Summary  Patient Name: Rachel [name] DOB: 01/22/1986 MRN: [id number]  Date of admission: 01/16/2019  Admitting diagnosis: pregnancy Intrauterine pregnancy: [redacted]w[redacted]d     Secondary diagnosis: Gestational Hypertension and Preeclampsia   Date of discharge: 01/18/2019    Discharge diagnosis: Term Pregnancy Delivered and POD 2 s/p CS- NRFHR      Prenatal history: G2P0010   EDC : 02/05/2019, by Other Basis  Prenatal care at [facility]  Primary provider : Almquist Prenatal course complicated by anxiety managed with Zoloft / GDMA1 / size > dates / gestational HTN  Prenatal Labs: ABO, Rh: --/--/A POS, A POS (04/15 0045)  Antibody: NEG (04/15 0045) Rubella: Immune (10/07 0000)   RPR: Non Reactive (04/15 0034)  HBsAg: Negative (10/07 0000)  HIV: Non-reactive (10/07 0000)  GBS: Negative (04/06 0000)                                    Hospital course:  Induction of Labor With Cesarean Section  33 y.o. yo G2P0010 at 105w3d was admitted to the hospital 01/16/2019 for induction of labor. Patient had a labor course significant for NRFHR with repetitive late decels remote from delivery. The patient went for cesarean section due to Non-Reassuring FHR, and delivered a Viable infant,01/16/2019  Membrane Rupture Time/Date: 2:32 PM ,01/16/2019   Details of operation can be found in separate operative Note.  Patient had an uncomplicated postpartum course. She is ambulating, tolerating a regular diet, passing flatus, and urinating well.  Patient is discharged home in stable condition on 01/18/19.                                   Augmentation: AROM and pitocin Delivering PROVIDER: ALMQUIST, SUSAN E                                                            Complications: None  Newborn Data: Live born female  Birth Weight: 6 lb 13.3 oz (3099 g) APGAR: 8, 9  Newborn Delivery   Birth date/time:  01/16/2019 19:20:00 Delivery type:  C-Section, Low Transverse C-section  categorization:  Primary     Baby Feeding: Breast Disposition:home with mother  Post partum procedures:none  Labs: Lab Results  Component Value Date   WBC 18.1 (H) 01/17/2019   HGB 10.1 (L) 01/17/2019   HCT 30.8 (L) 01/17/2019   MCV 79.0 (L) 01/17/2019   PLT 155 01/17/2019   CMP Latest Ref Rng & Units 01/16/2019  Glucose 70 - 99 mg/dL 89  BUN 6 - 20 mg/dL 11  Creatinine 0.10 - 2.72 mg/dL 5.36  Sodium 644 - 034 mmol/L 135  Potassium 3.5 - 5.1 mmol/L 4.0  Chloride 98 - 111 mmol/L 105  CO2 22 - 32 mmol/L 21(L)  Calcium 8.9 - 10.3 mg/dL 9.0  Total Protein 6.5 - 8.1 g/dL 5.1(L)  Total Bilirubin 0.3 - 1.2 mg/dL 0.5  Alkaline Phos 38 - 126 U/L 209(H)  AST 15 - 41 U/L 21  ALT 0 - 44 U/L 12      Physical Exam @ time of discharge:  Vitals:  01/17/19 1030 01/17/19 1439 01/18/19 0600 01/18/19 1355  BP: 121/62 124/69 124/85 129/89  Pulse: 79 77 72 78  Resp: 18 18 16 16   Temp: 97.7 F (36.5 C)  98.4 F (36.9 C) 98.6 F (37 C)  TempSrc: Oral  Oral Oral  SpO2: 99% 99% 98% 100%  Weight:      Height:        General: alert, cooperative and no distress Lochia: appropriate Uterine Fundus: firm Perineum: intact Incision: Healing well with no significant drainage Extremities: DVT Evaluation: No evidence of DVT seen on physical exam.   Discharge instructions:  "Baby and Me Booklet" and [facility] Booklet  Discharge Medications:  Allergies as of 01/18/2019      Reactions   Amoxicillin Shortness Of Breath, Rash   Penicillins Shortness Of Breath, Rash   Did it involve swelling of the face/tongue/throat, SOB, or low BP? Yes Did it involve sudden or severe rash/hives, skin peeling, or any reaction on the inside of your mouth or nose? Yes Did you need to seek medical attention at a hospital or doctor's office? Yes When did it last happen?13 years ago If all above answers are "NO", may proceed with cephalosporin use.      Medication List    TAKE these medications    ibuprofen 800 MG tablet Commonly known as:  ADVIL Take 1 tablet (800 mg total) by mouth every 6 (six) hours.   prenatal multivitamin Tabs tablet Take 2 tablets by mouth daily at 12 noon.   sertraline 50 MG tablet Commonly known as:  ZOLOFT Take 1 tablet (50 mg total) by mouth daily. What changed:  how much to take   traMADol 50 MG tablet Commonly known as:  ULTRAM Take 1 tablet (50 mg total) by mouth every 6 (six) hours for 5 days.            Discharge Care Instructions  (From admission, onward)         Start     Ordered   01/18/19 0000  Discharge wound care:    Comments:  Leave honeycomb in place for 5 days - remove if get wet in shower. Leave steri-strips in place x 2 weeks. Keep incision clean and dry   01/18/19 1456          Diet: routine diet  Activity: Advance as tolerated. Pelvic rest x 6 weeks.   Follow up:6 weeks    Signed: Marlinda Mikeanya Brylon Brenning CNM, MSN, [facility] 01/18/2019, 2:56 PM

## 2019-01-18 NOTE — Progress Notes (Signed)
POSTOPERATIVE DAY # 2 S/P cs  S:         Reports feeling really sore - worse than yesterday             Tolerating po intake / no nausea / no vomiting / + flatus / no BM             Bleeding is spotting             Pain not well-controlled             Up ad lib / ambulatory/ voiding QS  Newborn Breast   O:  VS: BP 124/85 (BP Location: Left Arm)   Pulse 72   Temp 98.4 F (36.9 C) (Oral)   Resp 16   Ht 5' (1.524 m)   Wt 76.3 kg   SpO2 98%   BMI 32.85 kg/m    LABS:              Recent Labs    01/16/19 1515 01/17/19 0659  WBC 8.0 18.1*  HGB 12.1 10.1*  PLT 160 155               Bloodtype: --/--/A POS, A POS (04/15 0045)  Rubella: Immune (10/07 0000)                                Physical Exam:             Alert and Oriented X3  Lungs: Clear and unlabored  Heart: regular rate and rhythm / no mumurs  Abdomen: soft, non-tender, mildly distended, hypoactive BS             Fundus: firm, non-tender, U-1             Dressing intact              Incision:  approximated with suture / no erythema / no ecchymosis / no drainage  Perineum: intact  Lochia: light  Extremities: 1+ pedal edema, no calf pain or tenderness  A:        POD # 2 S/P CS             P:        Routine postoperative care              Adjust analgesia   Marlinda Mike CNM, MSN, [facility] 01/18/2019, 7:44 AM

## 2019-01-18 NOTE — Lactation Note (Signed)
This note was copied from a baby's chart. Lactation Consultation Note  Patient Name: Boy Roshawn Hanni LEXNT'Z Date: 01/18/2019 Reason for consult: Follow-up assessment;Early term 37-38.6wks;[place] Baby 40 hours old.  Mom states breasts are becoming hard.  Breasts are very full and slightly firm and warm.  Discussed prevention and treatment of engorgement.  Mom states baby had some difficulty latching during the night but improved this morning.  Assisted with positioning baby in cross cradle hold on left.  After a few minutes baby latched well and active suck/swallows noted.  Mom has a DEBP at home.  Answered questions about pumping.  Ice packs given to patient to reduce swelling.  Reviewed outpatient services and support and encouraged to call prn.  Maternal Data    Feeding Feeding Type: Breast Fed  LATCH Score Latch: Grasps breast easily, tongue down, lips flanged, rhythmical sucking.  Audible Swallowing: A few with stimulation  Type of Nipple: Everted at rest and after stimulation  Comfort (Breast/Nipple): Filling, red/small blisters or bruises, mild/mod discomfort  Hold (Positioning): Assistance needed to correctly position infant at breast and maintain latch.  LATCH Score: 7  Interventions Interventions: Assisted with latch;Skin to skin;Breast compression;Breast massage;Support pillows;Hand express;Position options;Ice  Lactation Tools Discussed/Used     Consult Status Consult Status: Complete Follow-up type: Call as needed    Huston Foley 01/18/2019, 11:49 AM

## 2019-01-19 NOTE — Anesthesia Postprocedure Evaluation (Signed)
Anesthesia Post Note  Patient: Rachel [name]  Procedure(s) Performed: CESAREAN SECTION (N/A Abdomen)     Patient location during evaluation: PACU Anesthesia Type: Epidural Level of consciousness: oriented and awake and alert Pain management: pain level controlled Vital Signs Assessment: post-procedure vital signs reviewed and stable Respiratory status: spontaneous breathing, respiratory function stable and nonlabored ventilation Cardiovascular status: blood pressure returned to baseline and stable Postop Assessment: no headache, no backache, no apparent nausea or vomiting and epidural receding Anesthetic complications: no    Last Vitals:  Vitals:   01/18/19 0600 01/18/19 1355  BP: 124/85 129/89  Pulse: 72 78  Resp: 16 16  Temp: 36.9 C 37 C  SpO2: 98% 100%    Last Pain:  Vitals:   01/18/19 1738  TempSrc:   PainSc: 4                  Lucretia Kern

## 2019-07-05 ENCOUNTER — Ambulatory Visit: Payer: 59 | Admitting: Family Medicine

## 2019-07-05 ENCOUNTER — Encounter: Payer: Self-pay | Admitting: Family Medicine

## 2019-07-05 ENCOUNTER — Ambulatory Visit (INDEPENDENT_AMBULATORY_CARE_PROVIDER_SITE_OTHER): Payer: 59 | Admitting: Family Medicine

## 2019-07-05 ENCOUNTER — Other Ambulatory Visit: Payer: Self-pay

## 2019-07-05 VITALS — Temp 96.7°F | Wt 129.0 lb

## 2019-07-05 DIAGNOSIS — F331 Major depressive disorder, recurrent, moderate: Secondary | ICD-10-CM | POA: Diagnosis not present

## 2019-07-05 MED ORDER — SERTRALINE HCL 100 MG PO TABS: 100.0000 mg | ORAL_TABLET | Freq: Every day | ORAL | 1 refills | Status: DC

## 2019-07-05 NOTE — Assessment & Plan Note (Signed)
Stable and under good control with zoloft. Will increase to 100 mg and monitor for tolerance/benefit. F/u in 6 months

## 2019-07-05 NOTE — Progress Notes (Signed)
Temp (!) 96.7 F (35.9 C)   Wt 129 lb (58.5 kg)   BMI 25.19 kg/m    Subjective:    Patient ID: Rachel [name], female    DOB: 1986-04-12, 33 y.o.   MRN: [id number]  HPI: Rachel [name] is a 33 y.o. female  Chief Complaint  Patient presents with  . Depression    Patient is taking 1.5 tablets of Zoloft daily.    . This visit was completed via webex today"} due to the restrictions of the COVID-19 pandemic. All issues as above were discussed and addressed. Physical exam was done as above through visual confirmation on WebEx. If it was felt that the patient should be evaluated in the office, they were directed there. The patient verbally consented to this visit. . Location of the patient: home . Location of the provider: work . Those involved with this call:  . Provider: Roosvelt Maser, PA-C . CMA: Tiffany Reel, CMA . Front Desk/Registration: Harriet Pho  . Time spent on call: 15 minutes with patient face to face via video conference. More than 50% of this time was spent in counseling and coordination of care. 5 minutes total spent in review of patient's record and preparation of their chart. I verified patient identity using two factors (patient name and date of birth). Patient consents verbally to being seen via telemedicine visit today.   Patient presenting today for mood f/u. Was increased to 75 mg zoloft during pregnancy through GYN and has been doing very well post partum on this dose but finds cutting the pills in half bothersome. Wanting to try 100 mg dose. No side effects, labile moods, SI/HI. Incredibly satisfied with her life post partum and loving her new role of motherhood.   Depression screen [facility] 2/9 07/05/2019 11/22/2018 03/29/2018  Decreased Interest 0 0 0  Down, Depressed, Hopeless 0 0 0  PHQ - 2 Score 0 0 0  Altered sleeping 0 - 0  Tired, decreased energy 0 - 0  Change in appetite 0 - 0  Feeling bad or failure about yourself  0 - 0  Trouble concentrating 0 - 0   Moving slowly or fidgety/restless 0 - 0  Suicidal thoughts 0 - 0  PHQ-9 Score 0 - 0  Difficult doing work/chores Not difficult at all - Not difficult at all   GAD 7 : Generalized Anxiety Score 03/29/2018  Nervous, Anxious, on Edge 1  Control/stop worrying 0  Worry too much - different things 1  Trouble relaxing 1  Restless 0  Easily annoyed or irritable 0  Afraid - awful might happen 0  Total GAD 7 Score 3  Anxiety Difficulty Not difficult at all   Relevant past medical, surgical, family and social history reviewed and updated as indicated. Interim medical history since our last visit reviewed. Allergies and medications reviewed and updated.  Review of Systems  Per HPI unless specifically indicated above     Objective:    Temp (!) 96.7 F (35.9 C)   Wt 129 lb (58.5 kg)   BMI 25.19 kg/m   Wt Readings from Last 3 Encounters:  07/05/19 129 lb (58.5 kg)  01/16/19 168 lb 3.2 oz (76.3 kg)  01/14/19 166 lb 14.4 oz (75.7 kg)    Physical Exam Vitals signs and nursing note reviewed.  Constitutional:      General: She is not in acute distress.    Appearance: Normal appearance.  HENT:     Head: Atraumatic.  Right Ear: External ear normal.     Left Ear: External ear normal.     Nose: Nose normal. No congestion.     Mouth/Throat:     Mouth: Mucous membranes are moist.     Pharynx: Oropharynx is clear. No posterior oropharyngeal erythema.  Eyes:     Extraocular Movements: Extraocular movements intact.     Conjunctiva/sclera: Conjunctivae normal.  Neck:     Musculoskeletal: Normal range of motion.  Cardiovascular:     Comments: Unable to assess via virtual visit Pulmonary:     Effort: Pulmonary effort is normal. No respiratory distress.  Musculoskeletal: Normal range of motion.  Skin:    General: Skin is dry.     Findings: No erythema.  Neurological:     Mental Status: She is alert and oriented to person, place, and time.  Psychiatric:        Mood and Affect: Mood  normal.        Thought Content: Thought content normal.        Judgment: Judgment normal.     Results for orders placed or performed during the hospital encounter of 01/16/19  OB RESULT CONSOLE Group B Strep  Result Value Ref Range   GBS Negative   CBC  Result Value Ref Range   WBC 9.8 4.0 - 10.5 K/uL   RBC 4.31 3.87 - 5.11 MIL/uL   Hemoglobin 11.3 (L) 12.0 - 15.0 g/dL   HCT 33.6 (L) 36.0 - 46.0 %   MCV 78.0 (L) 80.0 - 100.0 fL   MCH 26.2 26.0 - 34.0 pg   MCHC 33.6 30.0 - 36.0 g/dL   RDW 14.1 11.5 - 15.5 %   Platelets 162 150 - 400 K/uL   nRBC 0.0 0.0 - 0.2 %  RPR  Result Value Ref Range   RPR Ser Ql Non Reactive Non Reactive  Glucose, capillary  Result Value Ref Range   Glucose-Capillary 91 70 - 99 mg/dL  Glucose, capillary  Result Value Ref Range   Glucose-Capillary 74 70 - 99 mg/dL  Glucose, capillary  Result Value Ref Range   Glucose-Capillary 60 (L) 70 - 99 mg/dL  Glucose, capillary  Result Value Ref Range   Glucose-Capillary 98 70 - 99 mg/dL  Comprehensive metabolic panel  Result Value Ref Range   Sodium 135 135 - 145 mmol/L   Potassium 4.0 3.5 - 5.1 mmol/L   Chloride 105 98 - 111 mmol/L   CO2 21 (L) 22 - 32 mmol/L   Glucose, Bld 89 70 - 99 mg/dL   BUN 11 6 - 20 mg/dL   Creatinine, Ser 0.63 0.44 - 1.00 mg/dL   Calcium 9.0 8.9 - 10.3 mg/dL   Total Protein 5.1 (L) 6.5 - 8.1 g/dL   Albumin 2.5 (L) 3.5 - 5.0 g/dL   AST 21 15 - 41 U/L   ALT 12 0 - 44 U/L   Alkaline Phosphatase 209 (H) 38 - 126 U/L   Total Bilirubin 0.5 0.3 - 1.2 mg/dL   GFR calc non Af Amer >60 >60 mL/min   GFR calc Af Amer >60 >60 mL/min   Anion gap 9 5 - 15  Protein / creatinine ratio, urine  Result Value Ref Range   Creatinine, Urine 72.77 mg/dL   Total Protein, Urine 64 mg/dL   Protein Creatinine Ratio 0.88 (H) 0.00 - 0.15 mg/mg[Cre]  CBC  Result Value Ref Range   WBC 8.0 4.0 - 10.5 K/uL   RBC 4.81 3.87 - 5.11 MIL/uL  Hemoglobin 12.1 12.0 - 15.0 g/dL   HCT 16.137.7 09.636.0 - 04.546.0 %    MCV 78.4 (L) 80.0 - 100.0 fL   MCH 25.2 (L) 26.0 - 34.0 pg   MCHC 32.1 30.0 - 36.0 g/dL   RDW 40.913.9 81.111.5 - 91.415.5 %   Platelets 160 150 - 400 K/uL   nRBC 0.0 0.0 - 0.2 %  Glucose, capillary  Result Value Ref Range   Glucose-Capillary 89 70 - 99 mg/dL  Glucose, capillary  Result Value Ref Range   Glucose-Capillary 112 (H) 70 - 99 mg/dL  CBC  Result Value Ref Range   WBC 18.1 (H) 4.0 - 10.5 K/uL   RBC 3.90 3.87 - 5.11 MIL/uL   Hemoglobin 10.1 (L) 12.0 - 15.0 g/dL   HCT 78.230.8 (L) 95.636.0 - 21.346.0 %   MCV 79.0 (L) 80.0 - 100.0 fL   MCH 25.9 (L) 26.0 - 34.0 pg   MCHC 32.8 30.0 - 36.0 g/dL   RDW 08.614.1 57.811.5 - 46.915.5 %   Platelets 155 150 - 400 K/uL   nRBC 0.0 0.0 - 0.2 %  Glucose, capillary  Result Value Ref Range   Glucose-Capillary 84 70 - 99 mg/dL  Type and screen  Result Value Ref Range   ABO/RH(D) A POS    Antibody Screen NEG    Sample Expiration      01/19/2019 Performed at [facility], [address] [place], [place] [address]   ABO/Rh  Result Value Ref Range   ABO/RH(D) A POS    No rh immune globuloin      NOT A RH IMMUNE GLOBULIN CANDIDATE, PT RH POSITIVE Performed at [facility], [address] [place], [place] [address]       Assessment & Plan:   Problem List Items Addressed This Visit      Other   Moderate episode of recurrent major depressive disorder (HCC) - Primary    Stable and under good control with zoloft. Will increase to 100 mg and monitor for tolerance/benefit. F/u in 6 months      Relevant Medications   sertraline (ZOLOFT) 100 MG tablet       Follow up plan: Return in about 6 months (around 01/03/2020) for Mood f/u.

## 2019-11-12 ENCOUNTER — Other Ambulatory Visit: Payer: Self-pay | Admitting: Student

## 2019-11-12 DIAGNOSIS — M24022 Loose body in left elbow: Secondary | ICD-10-CM

## 2019-11-12 DIAGNOSIS — M25522 Pain in left elbow: Secondary | ICD-10-CM

## 2019-11-12 DIAGNOSIS — M958 Other specified acquired deformities of musculoskeletal system: Secondary | ICD-10-CM

## 2019-11-19 ENCOUNTER — Ambulatory Visit (INDEPENDENT_AMBULATORY_CARE_PROVIDER_SITE_OTHER): Payer: 59 | Admitting: Family Medicine

## 2019-11-19 ENCOUNTER — Encounter: Payer: Self-pay | Admitting: Family Medicine

## 2019-11-19 DIAGNOSIS — F419 Anxiety disorder, unspecified: Secondary | ICD-10-CM

## 2019-11-19 MED ORDER — HYDROXYZINE HCL 25 MG PO TABS: 12.5000 mg | ORAL_TABLET | Freq: Three times a day (TID) | ORAL | 0 refills | Status: DC | PRN

## 2019-11-19 MED ORDER — SERTRALINE HCL 50 MG PO TABS: 50.0000 mg | ORAL_TABLET | Freq: Every day | ORAL | 0 refills | Status: DC

## 2019-11-19 NOTE — Progress Notes (Signed)
There were no vitals taken for this visit.   Subjective:    Patient ID: Rachel [name], female    DOB: 11-24-1985, 34 y.o.   MRN: [id number]  HPI: Rachel [name] is a 34 y.o. female  Chief Complaint  Patient presents with  . Anxiety    . This visit was completed via mychart due to the restrictions of the COVID-19 pandemic. All issues as above were discussed and addressed. Physical exam was done as above through visual confirmation on mychart. If it was felt that the patient should be evaluated in the office, they were directed there. The patient verbally consented to this visit. . Location of the patient: home . Location of the provider: home . Those involved with this call:  . Provider: Roosvelt Maser, PA-C . CMA: Elton Sin, CMA . Front Desk/Registration: Harriet Pho  . Time spent on call: 15 minutes with patient face to face via video conference. More than 50% of this time was spent in counseling and coordination of care. 5 minutes total spent in review of patient's record and preparation of their chart. I verified patient identity using two factors (patient name and date of birth). Patient consents verbally to being seen via telemedicine visit today.   Patient presenting today with some worsening anxiety issues the past 3 weeks. Has been stable on 100 mg zoloft throughout majority of post-partum period (about 10 months now) but the past few weeks has noted increased baseline anxiety and 3 episodes of panic attacks/overwhelm that have lasted about an hour each time and are followed by tension headaches. Denies new stressors, mood concerns, SI/HI.   Relevant past medical, surgical, family and social history reviewed and updated as indicated. Interim medical history since our last visit reviewed. Allergies and medications reviewed and updated.  Review of Systems  Per HPI unless specifically indicated above     Objective:    There were no vitals taken for this visit.  Wt  Readings from Last 3 Encounters:  07/05/19 129 lb (58.5 kg)  01/16/19 168 lb 3.2 oz (76.3 kg)  01/14/19 166 lb 14.4 oz (75.7 kg)    Physical Exam Vitals and nursing note reviewed.  Constitutional:      General: She is not in acute distress.    Appearance: Normal appearance.  HENT:     Head: Atraumatic.     Right Ear: External ear normal.     Left Ear: External ear normal.     Nose: Nose normal. No congestion.     Mouth/Throat:     Mouth: Mucous membranes are moist.     Pharynx: Oropharynx is clear. No posterior oropharyngeal erythema.  Eyes:     Extraocular Movements: Extraocular movements intact.     Conjunctiva/sclera: Conjunctivae normal.  Cardiovascular:     Comments: Unable to assess via virtual visit Pulmonary:     Effort: Pulmonary effort is normal. No respiratory distress.  Musculoskeletal:        General: Normal range of motion.     Cervical back: Normal range of motion.  Skin:    General: Skin is dry.     Findings: No erythema.  Neurological:     Mental Status: She is alert and oriented to person, place, and time.  Psychiatric:        Mood and Affect: Mood normal.        Thought Content: Thought content normal.        Judgment: Judgment normal.     Results  for orders placed or performed during the hospital encounter of 01/16/19  OB RESULT CONSOLE Group B Strep  Result Value Ref Range   GBS Negative   CBC  Result Value Ref Range   WBC 9.8 4.0 - 10.5 K/uL   RBC 4.31 3.87 - 5.11 MIL/uL   Hemoglobin 11.3 (L) 12.0 - 15.0 g/dL   HCT 86.5 (L) 78.4 - 69.6 %   MCV 78.0 (L) 80.0 - 100.0 fL   MCH 26.2 26.0 - 34.0 pg   MCHC 33.6 30.0 - 36.0 g/dL   RDW 29.5 28.4 - 13.2 %   Platelets 162 150 - 400 K/uL   nRBC 0.0 0.0 - 0.2 %  RPR  Result Value Ref Range   RPR Ser Ql Non Reactive Non Reactive  Glucose, capillary  Result Value Ref Range   Glucose-Capillary 91 70 - 99 mg/dL  Glucose, capillary  Result Value Ref Range   Glucose-Capillary 74 70 - 99 mg/dL   Glucose, capillary  Result Value Ref Range   Glucose-Capillary 60 (L) 70 - 99 mg/dL  Glucose, capillary  Result Value Ref Range   Glucose-Capillary 98 70 - 99 mg/dL  Comprehensive metabolic panel  Result Value Ref Range   Sodium 135 135 - 145 mmol/L   Potassium 4.0 3.5 - 5.1 mmol/L   Chloride 105 98 - 111 mmol/L   CO2 21 (L) 22 - 32 mmol/L   Glucose, Bld 89 70 - 99 mg/dL   BUN 11 6 - 20 mg/dL   Creatinine, Ser 4.40 0.44 - 1.00 mg/dL   Calcium 9.0 8.9 - 10.2 mg/dL   Total Protein 5.1 (L) 6.5 - 8.1 g/dL   Albumin 2.5 (L) 3.5 - 5.0 g/dL   AST 21 15 - 41 U/L   ALT 12 0 - 44 U/L   Alkaline Phosphatase 209 (H) 38 - 126 U/L   Total Bilirubin 0.5 0.3 - 1.2 mg/dL   GFR calc non Af Amer >60 >60 mL/min   GFR calc Af Amer >60 >60 mL/min   Anion gap 9 5 - 15  Protein / creatinine ratio, urine  Result Value Ref Range   Creatinine, Urine 72.77 mg/dL   Total Protein, Urine 64 mg/dL   Protein Creatinine Ratio 0.88 (H) 0.00 - 0.15 mg/mg[Cre]  CBC  Result Value Ref Range   WBC 8.0 4.0 - 10.5 K/uL   RBC 4.81 3.87 - 5.11 MIL/uL   Hemoglobin 12.1 12.0 - 15.0 g/dL   HCT 72.5 36.6 - 44.0 %   MCV 78.4 (L) 80.0 - 100.0 fL   MCH 25.2 (L) 26.0 - 34.0 pg   MCHC 32.1 30.0 - 36.0 g/dL   RDW 34.7 42.5 - 95.6 %   Platelets 160 150 - 400 K/uL   nRBC 0.0 0.0 - 0.2 %  Glucose, capillary  Result Value Ref Range   Glucose-Capillary 89 70 - 99 mg/dL  Glucose, capillary  Result Value Ref Range   Glucose-Capillary 112 (H) 70 - 99 mg/dL  CBC  Result Value Ref Range   WBC 18.1 (H) 4.0 - 10.5 K/uL   RBC 3.90 3.87 - 5.11 MIL/uL   Hemoglobin 10.1 (L) 12.0 - 15.0 g/dL   HCT 38.7 (L) 56.4 - 33.2 %   MCV 79.0 (L) 80.0 - 100.0 fL   MCH 25.9 (L) 26.0 - 34.0 pg   MCHC 32.8 30.0 - 36.0 g/dL   RDW 95.1 88.4 - 16.6 %   Platelets 155 150 - 400 K/uL  nRBC 0.0 0.0 - 0.2 %  Glucose, capillary  Result Value Ref Range   Glucose-Capillary 84 70 - 99 mg/dL  Type and screen  Result Value Ref Range   ABO/RH(D) A  POS    Antibody Screen NEG    Sample Expiration      01/19/2019 Performed at [facility] [address], [place], [place] [address]   ABO/Rh  Result Value Ref Range   ABO/RH(D) A POS    No rh immune globuloin      NOT A RH IMMUNE GLOBULIN CANDIDATE, PT RH POSITIVE Performed at [facility] [address], [place], [place] [address]       Assessment & Plan:   Problem List Items Addressed This Visit      Other   Anxiety    Will increase zoloft to 150 mg and add prn hydroxyzine to take sparingly as needed. F/u in 1 month for recheck      Relevant Medications   sertraline (ZOLOFT) 50 MG tablet   hydrOXYzine (ATARAX/VISTARIL) 25 MG tablet       Follow up plan: Return in about 4 weeks (around 12/17/2019) for anxiety f/u.

## 2019-11-19 NOTE — Assessment & Plan Note (Signed)
Will increase zoloft to 150 mg and add prn hydroxyzine to take sparingly as needed. F/u in 1 month for recheck

## 2019-11-21 ENCOUNTER — Ambulatory Visit: Admission: RE | Admit: 2019-11-21 | Payer: 59 | Source: Ambulatory Visit

## 2019-11-28 ENCOUNTER — Ambulatory Visit
Admission: RE | Admit: 2019-11-28 | Discharge: 2019-11-28 | Disposition: A | Discharge status: Home / Self Care | Payer: 59 | Source: Ambulatory Visit | Attending: Student | Admitting: Student

## 2019-11-28 ENCOUNTER — Other Ambulatory Visit: Payer: Self-pay

## 2019-11-28 DIAGNOSIS — M958 Other specified acquired deformities of musculoskeletal system: Secondary | ICD-10-CM

## 2019-11-28 DIAGNOSIS — M25522 Pain in left elbow: Secondary | ICD-10-CM | POA: Insufficient documentation

## 2019-11-28 DIAGNOSIS — M24022 Loose body in left elbow: Secondary | ICD-10-CM | POA: Diagnosis present

## 2019-12-11 ENCOUNTER — Other Ambulatory Visit: Payer: Self-pay | Admitting: Family Medicine

## 2019-12-11 ENCOUNTER — Other Ambulatory Visit: Payer: Self-pay | Admitting: Surgery

## 2019-12-17 ENCOUNTER — Telehealth (INDEPENDENT_AMBULATORY_CARE_PROVIDER_SITE_OTHER): Payer: 59 | Admitting: Family Medicine

## 2019-12-17 ENCOUNTER — Encounter: Payer: Self-pay | Admitting: Family Medicine

## 2019-12-17 DIAGNOSIS — F419 Anxiety disorder, unspecified: Secondary | ICD-10-CM | POA: Diagnosis not present

## 2019-12-17 MED ORDER — SERTRALINE HCL 100 MG PO TABS: 100.0000 mg | ORAL_TABLET | Freq: Every day | ORAL | 1 refills | Status: DC

## 2019-12-17 MED ORDER — SERTRALINE HCL 50 MG PO TABS: 50.0000 mg | ORAL_TABLET | Freq: Every day | ORAL | 1 refills | Status: DC

## 2019-12-17 NOTE — Progress Notes (Signed)
There were no vitals taken for this visit.   Subjective:    Patient ID: Rachel [name], female    DOB: Jul 15, 1986, 34 y.o.   MRN: [id number]  HPI: Rachel [name] is a 34 y.o. female  Chief Complaint  Patient presents with  . Anxiety    4 week f/up    . This visit was completed via MyChart due to the restrictions of the COVID-19 pandemic. All issues as above were discussed and addressed. Physical exam was done as above through visual confirmation on MyChart. If it was felt that the patient should be evaluated in the office, they were directed there. The patient verbally consented to this visit. . Location of the patient: home . Location of the provider: home . Those involved with this call:  . Provider: Roosvelt Maser, PA-C . CMA: Elton Sin, CMA . Front Desk/Registration: Harriet Pho  . Time spent on call: 15 minutes with patient face to face via video conference. More than 50% of this time was spent in counseling and coordination of care. 5 minutes total spent in review of patient's record and preparation of their chart. I verified patient identity using two factors (patient name and date of birth). Patient consents verbally to being seen via telemedicine visit today.   Presenting today for anxiety f/u after increasing zoloft dose. Feeling much better, has not had any panic attacks since. Has not needed the hydroxyzine, feels better just knowing it's there if needed. Denies side effects, SI/HI, sleep or appetite concerns.   Depression screen [facility] 2/9 12/17/2019 11/19/2019 07/05/2019  Decreased Interest 0 0 0  Down, Depressed, Hopeless 0 0 0  PHQ - 2 Score 0 0 0  Altered sleeping 0 1 0  Tired, decreased energy 0 1 0  Change in appetite 0 0 0  Feeling bad or failure about yourself  0 1 0  Trouble concentrating 0 0 0  Moving slowly or fidgety/restless 0 0 0  Suicidal thoughts 0 0 0  PHQ-9 Score 0 3 0  Difficult doing work/chores Not difficult at all Somewhat difficult Not  difficult at all   GAD 7 : Generalized Anxiety Score 12/17/2019 11/19/2019 03/29/2018  Nervous, Anxious, on Edge 0 2 1  Control/stop worrying 0 2 0  Worry too much - different things 0 1 1  Trouble relaxing 0 2 1  Restless 0 0 0  Easily annoyed or irritable 0 2 0  Afraid - awful might happen 0 1 0  Total GAD 7 Score 0 10 3  Anxiety Difficulty Not difficult at all Somewhat difficult Not difficult at all   Relevant past medical, surgical, family and social history reviewed and updated as indicated. Interim medical history since our last visit reviewed. Allergies and medications reviewed and updated.  Review of Systems  Per HPI unless specifically indicated above     Objective:    There were no vitals taken for this visit.  Wt Readings from Last 3 Encounters:  07/05/19 129 lb (58.5 kg)  01/16/19 168 lb 3.2 oz (76.3 kg)  01/14/19 166 lb 14.4 oz (75.7 kg)    Physical Exam Vitals and nursing note reviewed.  Constitutional:      General: She is not in acute distress.    Appearance: Normal appearance.  HENT:     Head: Atraumatic.     Right Ear: External ear normal.     Left Ear: External ear normal.     Nose: Nose normal. No congestion.  Mouth/Throat:     Mouth: Mucous membranes are moist.     Pharynx: Oropharynx is clear. No posterior oropharyngeal erythema.  Eyes:     Extraocular Movements: Extraocular movements intact.     Conjunctiva/sclera: Conjunctivae normal.  Cardiovascular:     Comments: Unable to assess via virtual visit Pulmonary:     Effort: Pulmonary effort is normal. No respiratory distress.  Musculoskeletal:        General: Normal range of motion.     Cervical back: Normal range of motion.  Skin:    General: Skin is dry.     Findings: No erythema.  Neurological:     Mental Status: She is alert and oriented to person, place, and time.  Psychiatric:        Mood and Affect: Mood normal.        Thought Content: Thought content normal.        Judgment:  Judgment normal.     Results for orders placed or performed during the hospital encounter of 01/16/19  OB RESULT CONSOLE Group B Strep  Result Value Ref Range   GBS Negative   CBC  Result Value Ref Range   WBC 9.8 4.0 - 10.5 K/uL   RBC 4.31 3.87 - 5.11 MIL/uL   Hemoglobin 11.3 (L) 12.0 - 15.0 g/dL   HCT 33.6 (L) 36.0 - 46.0 %   MCV 78.0 (L) 80.0 - 100.0 fL   MCH 26.2 26.0 - 34.0 pg   MCHC 33.6 30.0 - 36.0 g/dL   RDW 14.1 11.5 - 15.5 %   Platelets 162 150 - 400 K/uL   nRBC 0.0 0.0 - 0.2 %  RPR  Result Value Ref Range   RPR Ser Ql Non Reactive Non Reactive  Glucose, capillary  Result Value Ref Range   Glucose-Capillary 91 70 - 99 mg/dL  Glucose, capillary  Result Value Ref Range   Glucose-Capillary 74 70 - 99 mg/dL  Glucose, capillary  Result Value Ref Range   Glucose-Capillary 60 (L) 70 - 99 mg/dL  Glucose, capillary  Result Value Ref Range   Glucose-Capillary 98 70 - 99 mg/dL  Comprehensive metabolic panel  Result Value Ref Range   Sodium 135 135 - 145 mmol/L   Potassium 4.0 3.5 - 5.1 mmol/L   Chloride 105 98 - 111 mmol/L   CO2 21 (L) 22 - 32 mmol/L   Glucose, Bld 89 70 - 99 mg/dL   BUN 11 6 - 20 mg/dL   Creatinine, Ser 0.63 0.44 - 1.00 mg/dL   Calcium 9.0 8.9 - 10.3 mg/dL   Total Protein 5.1 (L) 6.5 - 8.1 g/dL   Albumin 2.5 (L) 3.5 - 5.0 g/dL   AST 21 15 - 41 U/L   ALT 12 0 - 44 U/L   Alkaline Phosphatase 209 (H) 38 - 126 U/L   Total Bilirubin 0.5 0.3 - 1.2 mg/dL   GFR calc non Af Amer >60 >60 mL/min   GFR calc Af Amer >60 >60 mL/min   Anion gap 9 5 - 15  Protein / creatinine ratio, urine  Result Value Ref Range   Creatinine, Urine 72.77 mg/dL   Total Protein, Urine 64 mg/dL   Protein Creatinine Ratio 0.88 (H) 0.00 - 0.15 mg/mg[Cre]  CBC  Result Value Ref Range   WBC 8.0 4.0 - 10.5 K/uL   RBC 4.81 3.87 - 5.11 MIL/uL   Hemoglobin 12.1 12.0 - 15.0 g/dL   HCT 37.7 36.0 - 46.0 %   MCV 78.4 (  L) 80.0 - 100.0 fL   MCH 25.2 (L) 26.0 - 34.0 pg   MCHC 32.1  30.0 - 36.0 g/dL   RDW 23.5 57.3 - 22.0 %   Platelets 160 150 - 400 K/uL   nRBC 0.0 0.0 - 0.2 %  Glucose, capillary  Result Value Ref Range   Glucose-Capillary 89 70 - 99 mg/dL  Glucose, capillary  Result Value Ref Range   Glucose-Capillary 112 (H) 70 - 99 mg/dL  CBC  Result Value Ref Range   WBC 18.1 (H) 4.0 - 10.5 K/uL   RBC 3.90 3.87 - 5.11 MIL/uL   Hemoglobin 10.1 (L) 12.0 - 15.0 g/dL   HCT 25.4 (L) 27.0 - 62.3 %   MCV 79.0 (L) 80.0 - 100.0 fL   MCH 25.9 (L) 26.0 - 34.0 pg   MCHC 32.8 30.0 - 36.0 g/dL   RDW 76.2 83.1 - 51.7 %   Platelets 155 150 - 400 K/uL   nRBC 0.0 0.0 - 0.2 %  Glucose, capillary  Result Value Ref Range   Glucose-Capillary 84 70 - 99 mg/dL  Type and screen  Result Value Ref Range   ABO/RH(D) A POS    Antibody Screen NEG    Sample Expiration      01/19/2019 Performed at [facility], [address] [place], [place] [address]   ABO/Rh  Result Value Ref Range   ABO/RH(D) A POS    No rh immune globuloin      NOT A RH IMMUNE GLOBULIN CANDIDATE, PT RH POSITIVE Performed at [facility], [address] [place], [place] [address]       Assessment & Plan:   Problem List Items Addressed This Visit      Other   Anxiety - Primary    Anxiety stable and under good control, continue current regimen      Relevant Medications   sertraline (ZOLOFT) 50 MG tablet   sertraline (ZOLOFT) 100 MG tablet       Follow up plan: Return in about 6 months (around 06/18/2020) for 6 month f/u.

## 2019-12-19 NOTE — Assessment & Plan Note (Signed)
Anxiety stable and under good control, continue current regimen

## 2019-12-26 ENCOUNTER — Encounter
Admission: RE | Admit: 2019-12-26 | Discharge: 2019-12-26 | Disposition: A | Discharge status: Home / Self Care | Payer: 59 | Source: Ambulatory Visit | Attending: Surgery | Admitting: Surgery

## 2019-12-26 ENCOUNTER — Other Ambulatory Visit: Payer: Self-pay

## 2019-12-26 DIAGNOSIS — Z01818 Encounter for other preprocedural examination: Secondary | ICD-10-CM | POA: Insufficient documentation

## 2019-12-26 HISTORY — DX: Anemia, unspecified: D64.9

## 2019-12-26 HISTORY — DX: Gastro-esophageal reflux disease without esophagitis: K21.9

## 2019-12-26 NOTE — Patient Instructions (Signed)
Your procedure is scheduled on: 01-02-20 THURSDAY Report to Same Day Surgery 2nd floor medical mall [facility] Entrance-take elevator on left to 2nd floor.  Check in with surgery information desk.) To find out your arrival time please call [phone] between 1PM - 3PM on 01-01-20 [facility]  Remember: Instructions that are not followed completely may result in serious medical risk, up to and including death, or upon the discretion of your surgeon and anesthesiologist your surgery may need to be rescheduled.    _x___ 1. Do not eat food after midnight the night before your procedure. NO GUM OR CANDY AFTER MIDNIGHT. You may drink clear liquids up to 2 hours before you are scheduled to arrive at the hospital for your procedure.  Do not drink clear liquids within 2 hours of your scheduled arrival to the hospital.  Clear liquids include  --Water or Apple juice without pulp  --Gatorade  --Black Coffee or Clear Tea (No milk, no creamers, do not add anything to the coffee or Tea   ____Ensure clear carbohydrate drink on the way to the hospital for bariatric patients  _X___Ensure clear carbohydrate drink-FINISH DRINK 2 HOURS PRIOR TO ARRIVAL TIME TO HOSPITAL    __x__ 2. No Alcohol for 24 hours before or after surgery.   __x__3. No Smoking or e-cigarettes for 24 prior to surgery.  Do not use any chewable tobacco products for at least 6 hour prior to surgery   ____  4. Bring all medications with you on the day of surgery if instructed.    __x__ 5. Notify your doctor if there is any change in your medical condition     (cold, fever, infections).    x___6. On the morning of surgery brush your teeth with toothpaste and water.  You may rinse your mouth with mouth wash if you wish.  Do not swallow any toothpaste or mouthwash.   Do not wear jewelry, make-up, hairpins, clips or nail polish.  Do not wear lotions, powders, or perfumes.   Do not shave 48 hours prior to surgery. Men may shave face and  neck.  Do not bring valuables to the hospital.    [facility] is not responsible for any belongings or valuables.               Contacts, dentures or bridgework may not be worn into surgery.  Leave your suitcase in the car. After surgery it may be brought to your room.  For patients admitted to the hospital, discharge time is determined by your treatment team.  _  Patients discharged the day of surgery will not be allowed to drive home.  You will need someone to drive you home and stay with you the night of your procedure.    Please read over the following fact sheets that you were given:   [facility] Preparing for Surgery/INCENTIVE SPIROMETER INSTRUCTIONS  _x___ TAKE THE FOLLOWING MEDICATION THE MORNING OF SURGERY WITH A SMALL SIP OF WATER. These include:  1. ZOLOFT (SERTRALINE)  2. YOU MAY TAKE HYDRALAZINE (ATARAX) DAY OF SURGERY IF NEEDED  3.  4.  5.  6.  ____Fleets enema or Magnesium Citrate as directed.   _x___ Use CHG Soap or sage wipes as directed on instruction sheet   ____ Use inhalers on the day of surgery and bring to hospital day of surgery  ____ Stop Metformin and Janumet 2 days prior to surgery.    ____ Take 1/2 of usual insulin dose the night before surgery and  none on the morning surgery.   ____ Follow recommendations from Cardiologist, Pulmonologist or PCP regarding stopping Aspirin, Coumadin, Plavix ,Eliquis, Effient, or Pradaxa, and Pletal.  X____Stop Anti-inflammatories such as Advil, Aleve, Ibuprofen, Motrin, Naproxen, Naprosyn, Goodies powders or aspirin products NOW-OK to take Tylenol    ____ Stop supplements until after surgery.     ____ Bring C-Pap to the hospital.

## 2019-12-31 ENCOUNTER — Other Ambulatory Visit: Payer: Self-pay

## 2019-12-31 ENCOUNTER — Other Ambulatory Visit
Admission: RE | Admit: 2019-12-31 | Discharge: 2019-12-31 | Disposition: A | Discharge status: Home / Self Care | Payer: 59 | Source: Ambulatory Visit | Attending: Surgery | Admitting: Surgery

## 2019-12-31 DIAGNOSIS — Z20822 Contact with and (suspected) exposure to covid-19: Secondary | ICD-10-CM | POA: Insufficient documentation

## 2019-12-31 DIAGNOSIS — Z01812 Encounter for preprocedural laboratory examination: Secondary | ICD-10-CM | POA: Insufficient documentation

## 2019-12-31 LAB — COMPREHENSIVE METABOLIC PANEL
ALT: 16 U/L (ref 0–44)
AST: 19 U/L (ref 15–41)
Albumin: 4.4 g/dL (ref 3.5–5.0)
Alkaline Phosphatase: 98 U/L (ref 38–126)
Anion gap: 11 (ref 5–15)
BUN: 20 mg/dL (ref 6–20)
CO2: 23 mmol/L (ref 22–32)
Calcium: 9.6 mg/dL (ref 8.9–10.3)
Chloride: 104 mmol/L (ref 98–111)
Creatinine, Ser: 0.62 mg/dL (ref 0.44–1.00)
GFR calc Af Amer: >60 mL/min (ref >60)
GFR calc non Af Amer: >60 mL/min (ref >60)
Glucose, Bld: 68 mg/dL — ABNORMAL LOW (ref 70–99)
Potassium: 4.3 mmol/L (ref 3.5–5.1)
Sodium: 138 mmol/L (ref 135–145)
Total Bilirubin: 0.5 mg/dL (ref 0.3–1.2)
Total Protein: 7.5 g/dL (ref 6.5–8.1)

## 2019-12-31 LAB — HEMOGLOBIN: Hemoglobin: 13.7 g/dL (ref 12.0–15.0)

## 2019-12-31 LAB — SARS CORONAVIRUS 2 (TAT 6-24 HRS): SARS Coronavirus 2: NEGATIVE

## 2020-01-01 MED ORDER — CLINDAMYCIN PHOSPHATE 900 MG/50ML IV SOLN
900.0000 mg | INTRAVENOUS | Status: AC
  Administered 2020-01-02: 900 mg via INTRAVENOUS

## 2020-01-02 ENCOUNTER — Ambulatory Visit: Payer: 59 | Admitting: Registered Nurse

## 2020-01-02 ENCOUNTER — Encounter: Payer: Self-pay | Admitting: Surgery

## 2020-01-02 ENCOUNTER — Ambulatory Visit: Payer: 59

## 2020-01-02 ENCOUNTER — Ambulatory Visit
Admission: RE | Admit: 2020-01-02 | Discharge: 2020-01-02 | Disposition: A | Discharge status: Home / Self Care | Payer: 59 | Attending: Surgery | Admitting: Surgery

## 2020-01-02 ENCOUNTER — Encounter
Admission: RE | Disposition: A | Discharge status: Home / Self Care | Payer: Self-pay | Source: Home / Self Care | Attending: Surgery

## 2020-01-02 ENCOUNTER — Other Ambulatory Visit: Payer: Self-pay

## 2020-01-02 DIAGNOSIS — M24022 Loose body in left elbow: Secondary | ICD-10-CM | POA: Insufficient documentation

## 2020-01-02 DIAGNOSIS — M659 Synovitis and tenosynovitis, unspecified: Secondary | ICD-10-CM | POA: Diagnosis not present

## 2020-01-02 DIAGNOSIS — I10 Essential (primary) hypertension: Secondary | ICD-10-CM | POA: Diagnosis not present

## 2020-01-02 DIAGNOSIS — Z87891 Personal history of nicotine dependence: Secondary | ICD-10-CM | POA: Diagnosis not present

## 2020-01-02 DIAGNOSIS — E119 Type 2 diabetes mellitus without complications: Secondary | ICD-10-CM | POA: Diagnosis not present

## 2020-01-02 DIAGNOSIS — Z419 Encounter for procedure for purposes other than remedying health state, unspecified: Secondary | ICD-10-CM

## 2020-01-02 DIAGNOSIS — Z79899 Other long term (current) drug therapy: Secondary | ICD-10-CM | POA: Diagnosis not present

## 2020-01-02 DIAGNOSIS — F329 Major depressive disorder, single episode, unspecified: Secondary | ICD-10-CM | POA: Diagnosis not present

## 2020-01-02 HISTORY — PX: ELBOW ARTHROSCOPY: SHX614

## 2020-01-02 LAB — POCT PREGNANCY, URINE: Preg Test, Ur: NEGATIVE

## 2020-01-02 LAB — GLUCOSE, CAPILLARY: Glucose-Capillary: 70 mg/dL (ref 70–99)

## 2020-01-02 SURGERY — ARTHROSCOPY, ELBOW
Anesthesia: General | Site: Elbow | Laterality: Left

## 2020-01-02 MED ORDER — BUPIVACAINE-EPINEPHRINE (PF) 0.5% -1:200000 IJ SOLN
INTRAMUSCULAR | Status: DC | PRN
  Administered 2020-01-02: 25 mL

## 2020-01-02 MED ORDER — MEPERIDINE HCL 50 MG/ML IJ SOLN: 6.2500 mg | INTRAMUSCULAR | Status: DC | PRN

## 2020-01-02 MED ORDER — LACTATED RINGERS IV SOLN: INTRAVENOUS | Status: DC

## 2020-01-02 MED ORDER — DEXAMETHASONE SODIUM PHOSPHATE 10 MG/ML IJ SOLN
INTRAMUSCULAR | Status: AC
  Filled 2020-01-02: qty 1

## 2020-01-02 MED ORDER — ONDANSETRON HCL 4 MG/2ML IJ SOLN
INTRAMUSCULAR | Status: DC | PRN
  Administered 2020-01-02: 4 mg via INTRAVENOUS

## 2020-01-02 MED ORDER — HYDROCODONE-ACETAMINOPHEN 5-325 MG PO TABS: 1.0000 | ORAL_TABLET | Freq: Four times a day (QID) | ORAL | 0 refills | Status: AC | PRN

## 2020-01-02 MED ORDER — PROPOFOL 500 MG/50ML IV EMUL
INTRAVENOUS | Status: AC
  Filled 2020-01-02: qty 50

## 2020-01-02 MED ORDER — KETAMINE HCL 10 MG/ML IJ SOLN
INTRAMUSCULAR | Status: DC | PRN
  Administered 2020-01-02: 30 mg via INTRAVENOUS
  Administered 2020-01-02: 20 mg via INTRAVENOUS

## 2020-01-02 MED ORDER — LIDOCAINE HCL (PF) 1 % IJ SOLN
INTRAMUSCULAR | Status: AC
  Filled 2020-01-02: qty 5

## 2020-01-02 MED ORDER — FENTANYL CITRATE (PF) 100 MCG/2ML IJ SOLN: 50.0000 ug | INTRAMUSCULAR | Status: DC | PRN

## 2020-01-02 MED ORDER — FENTANYL CITRATE (PF) 100 MCG/2ML IJ SOLN
INTRAMUSCULAR | Status: AC
  Administered 2020-01-02: 100 ug via INTRAVENOUS
  Filled 2020-01-02: qty 2

## 2020-01-02 MED ORDER — ROPIVACAINE HCL 5 MG/ML IJ SOLN
INTRAMUSCULAR | Status: AC
  Filled 2020-01-02: qty 30

## 2020-01-02 MED ORDER — MIDAZOLAM HCL 2 MG/2ML IJ SOLN
INTRAMUSCULAR | Status: DC | PRN
  Administered 2020-01-02: 2 mg via INTRAVENOUS

## 2020-01-02 MED ORDER — SODIUM CHLORIDE (PF) 0.9 % IJ SOLN
INTRAMUSCULAR | Status: AC
  Filled 2020-01-02: qty 10

## 2020-01-02 MED ORDER — BUPIVACAINE HCL (PF) 0.5 % IJ SOLN
INTRAMUSCULAR | Status: AC
  Filled 2020-01-02: qty 30

## 2020-01-02 MED ORDER — FENTANYL CITRATE (PF) 100 MCG/2ML IJ SOLN
INTRAMUSCULAR | Status: AC
  Filled 2020-01-02: qty 2

## 2020-01-02 MED ORDER — PROPOFOL 10 MG/ML IV BOLUS
INTRAVENOUS | Status: AC
  Filled 2020-01-02: qty 40

## 2020-01-02 MED ORDER — LACTATED RINGERS IV SOLN: INTRAVENOUS | Status: DC | PRN

## 2020-01-02 MED ORDER — PROPOFOL 500 MG/50ML IV EMUL
INTRAVENOUS | Status: DC | PRN
  Administered 2020-01-02: 75 ug/kg/min via INTRAVENOUS

## 2020-01-02 MED ORDER — EPINEPHRINE PF 1 MG/ML IJ SOLN
INTRAMUSCULAR | Status: AC
  Filled 2020-01-02: qty 4

## 2020-01-02 MED ORDER — PROPOFOL 10 MG/ML IV BOLUS
INTRAVENOUS | Status: DC | PRN
  Administered 2020-01-02 (×4): 50 mg via INTRAVENOUS
  Administered 2020-01-02: 80 mg via INTRAVENOUS
  Administered 2020-01-02: 50 mg via INTRAVENOUS
  Administered 2020-01-02: 20 mg via INTRAVENOUS

## 2020-01-02 MED ORDER — FAMOTIDINE 20 MG PO TABS: 20.0000 mg | ORAL_TABLET | Freq: Once | ORAL | Status: AC

## 2020-01-02 MED ORDER — MIDAZOLAM HCL 2 MG/2ML IJ SOLN
INTRAMUSCULAR | Status: AC
  Filled 2020-01-02: qty 2

## 2020-01-02 MED ORDER — EPINEPHRINE PF 1 MG/ML IJ SOLN
INTRAMUSCULAR | Status: AC
  Filled 2020-01-02: qty 1

## 2020-01-02 MED ORDER — KETAMINE HCL 50 MG/ML IJ SOLN
INTRAMUSCULAR | Status: AC
  Filled 2020-01-02: qty 10

## 2020-01-02 MED ORDER — DEXMEDETOMIDINE HCL IN NACL 200 MCG/50ML IV SOLN
INTRAVENOUS | Status: AC
  Filled 2020-01-02: qty 50

## 2020-01-02 MED ORDER — FENTANYL CITRATE (PF) 100 MCG/2ML IJ SOLN
INTRAMUSCULAR | Status: DC | PRN
  Administered 2020-01-02 (×2): 50 ug via INTRAVENOUS

## 2020-01-02 MED ORDER — FENTANYL CITRATE (PF) 100 MCG/2ML IJ SOLN: 25.0000 ug | INTRAMUSCULAR | Status: DC | PRN

## 2020-01-02 MED ORDER — PROMETHAZINE HCL 25 MG/ML IJ SOLN: 6.2500 mg | INTRAMUSCULAR | Status: DC | PRN

## 2020-01-02 MED ORDER — PROPOFOL 10 MG/ML IV BOLUS
INTRAVENOUS | Status: AC
  Filled 2020-01-02: qty 20

## 2020-01-02 MED ORDER — ACETAMINOPHEN 160 MG/5ML PO SOLN
325.0000 mg | ORAL | Status: DC | PRN
  Filled 2020-01-02: qty 20.3

## 2020-01-02 MED ORDER — ROPIVACAINE HCL 5 MG/ML IJ SOLN
INTRAMUSCULAR | Status: DC | PRN
  Administered 2020-01-02: 25 mL via PERINEURAL

## 2020-01-02 MED ORDER — HYDROCODONE-ACETAMINOPHEN 7.5-325 MG PO TABS
1.0000 | ORAL_TABLET | Freq: Once | ORAL | Status: DC | PRN
  Filled 2020-01-02: qty 1

## 2020-01-02 MED ORDER — DEXMEDETOMIDINE HCL 200 MCG/2ML IV SOLN
INTRAVENOUS | Status: DC | PRN
  Administered 2020-01-02 (×2): 20 ug via INTRAVENOUS

## 2020-01-02 MED ORDER — FAMOTIDINE 20 MG PO TABS
ORAL_TABLET | ORAL | Status: AC
  Administered 2020-01-02: 20 mg via ORAL
  Filled 2020-01-02: qty 1

## 2020-01-02 MED ORDER — ACETAMINOPHEN 325 MG PO TABS: 325.0000 mg | ORAL_TABLET | ORAL | Status: DC | PRN

## 2020-01-02 MED ORDER — LIDOCAINE HCL (CARDIAC) PF 100 MG/5ML IV SOSY
PREFILLED_SYRINGE | INTRAVENOUS | Status: DC | PRN
  Administered 2020-01-02: 60 mg via INTRAVENOUS

## 2020-01-02 MED ORDER — CLINDAMYCIN PHOSPHATE 900 MG/50ML IV SOLN
INTRAVENOUS | Status: AC
  Filled 2020-01-02: qty 50

## 2020-01-02 MED ORDER — MIDAZOLAM HCL 2 MG/2ML IJ SOLN: 1.0000 mg | INTRAMUSCULAR | Status: DC | PRN

## 2020-01-02 MED ORDER — KETOROLAC TROMETHAMINE 30 MG/ML IJ SOLN: 30.0000 mg | Freq: Once | INTRAMUSCULAR | Status: DC | PRN

## 2020-01-02 MED ORDER — CHLORHEXIDINE GLUCONATE 4 % EX LIQD
60.0000 mL | Freq: Once | CUTANEOUS | Status: AC
  Administered 2020-01-02: 4 via TOPICAL

## 2020-01-02 SURGICAL SUPPLY — 43 items
BLADE FULL RADIUS 3.5 (BLADE) ×2 IMPLANT
BNDG COHESIVE 4X5 TAN STRL (GAUZE/BANDAGES/DRESSINGS) ×2 IMPLANT
BNDG COHESIVE 6X5 TAN STRL LF (GAUZE/BANDAGES/DRESSINGS) ×1 IMPLANT
BNDG ELASTIC 4X5.8 VLCR STR LF (GAUZE/BANDAGES/DRESSINGS) ×2 IMPLANT
BNDG ESMARK 4X12 TAN STRL LF (GAUZE/BANDAGES/DRESSINGS) ×2 IMPLANT
BNDG GAUZE 4.5X4.1 6PLY STRL (MISCELLANEOUS) ×2 IMPLANT
BUR ABRADER 4.0 W/FLUTE AQUA (MISCELLANEOUS) ×1 IMPLANT
BURR ABRADER 4.0 W/FLUTE AQUA (MISCELLANEOUS)
CANNULA SHAVER SCOPE W/RIB (CANNULA) ×1 IMPLANT
CHLORAPREP W/TINT 26 (MISCELLANEOUS) ×3 IMPLANT
COVER WAND RF STERILE (DRAPES) ×2 IMPLANT
CUFF TOURN SGL QUICK 18X4 (TOURNIQUET CUFF) ×1 IMPLANT
CUFF TOURN SGL QUICK 24 (TOURNIQUET CUFF)
CUFF TOURN SGL QUICK 30 (TOURNIQUET CUFF)
CUFF TRNQT CYL 24X4X16.5-23 (TOURNIQUET CUFF) IMPLANT
CUFF TRNQT CYL 30X4X21-28X (TOURNIQUET CUFF) IMPLANT
DRAPE 3/4 80X56 (DRAPES) ×3 IMPLANT
DRAPE INCISE IOBAN 66X45 STRL (DRAPES) ×2 IMPLANT
DRAPE U-SHAPE 47X51 STRL (DRAPES) ×2 IMPLANT
ELECT REM PT RETURN 9FT ADLT (ELECTROSURGICAL) ×2
ELECTRODE REM PT RTRN 9FT ADLT (ELECTROSURGICAL) ×1 IMPLANT
GAUZE SPONGE 4X4 12PLY STRL (GAUZE/BANDAGES/DRESSINGS) ×2 IMPLANT
GLOVE BIO SURGEON STRL SZ8 (GLOVE) ×4 IMPLANT
GLOVE INDICATOR 8.0 STRL GRN (GLOVE) ×2 IMPLANT
GOWN STRL REUS W/ TWL LRG LVL3 (GOWN DISPOSABLE) ×2 IMPLANT
GOWN STRL REUS W/ TWL XL LVL3 (GOWN DISPOSABLE) ×2 IMPLANT
GOWN STRL REUS W/TWL LRG LVL3 (GOWN DISPOSABLE) ×2
GOWN STRL REUS W/TWL XL LVL3 (GOWN DISPOSABLE) ×2
IV LACTATED RINGER IRRG 3000ML (IV SOLUTION) ×1
IV LR IRRIG 3000ML ARTHROMATIC (IV SOLUTION) ×3 IMPLANT
KIT TURNOVER KIT A (KITS) ×2 IMPLANT
MANIFOLD NEPTUNE II (INSTRUMENTS) ×2 IMPLANT
MAT ABSORB  FLUID 56X50 GRAY (MISCELLANEOUS)
MAT ABSORB FLUID 56X50 GRAY (MISCELLANEOUS) ×1 IMPLANT
NS IRRIG 1000ML POUR BTL (IV SOLUTION) ×2 IMPLANT
PACK ARTHROSCOPY KNEE (MISCELLANEOUS) ×2 IMPLANT
PAD ABD DERMACEA PRESS 5X9 (GAUZE/BANDAGES/DRESSINGS) ×1 IMPLANT
SLING ARM LRG DEEP (SOFTGOODS) ×1 IMPLANT
SLING ARM M TX990204 (SOFTGOODS) ×2 IMPLANT
STOCKINETTE M/LG 89821 (MISCELLANEOUS) ×2 IMPLANT
SUT PROLENE 4 0 PS 2 18 (SUTURE) ×5 IMPLANT
TUBING ARTHRO INFLOW-ONLY STRL (TUBING) ×2 IMPLANT
WAND WEREWOLF FLOW 90D (MISCELLANEOUS) ×1 IMPLANT

## 2020-01-02 NOTE — Transfer of Care (Signed)
Immediate Anesthesia Transfer of Care Note  Patient: Rachel [name]  Procedure(s) Performed: LEFT ELBOW ARTHROSCOPY WITH FOREIGN BODY REMOVAL (Left Elbow)  Patient Location: PACU  Anesthesia Type:General and Regional  Level of Consciousness: awake  Airway & Oxygen Therapy: Patient Spontanous Breathing  Post-op Assessment: Report given to RN and Post -op Vital signs reviewed and stable  Post vital signs: Reviewed and stable  Last Vitals:  Vitals Value Taken Time  BP 95/53 01/02/20 1510  Temp    Pulse 108 01/02/20 1513  Resp 16 01/02/20 1513  SpO2 97 % 01/02/20 1513  Vitals shown include unvalidated device data.  Last Pain:  Vitals:   01/02/20 1217  TempSrc: Temporal  PainSc: 0-No pain         Complications: No apparent anesthesia complications

## 2020-01-02 NOTE — Anesthesia Procedure Notes (Signed)
Anesthesia Regional Block: Supraclavicular block   Pre-Anesthetic Checklist: ,, timeout performed, Correct Patient, Correct Site, Correct Laterality, Correct Procedure, Correct Position, site marked, Risks and benefits discussed,  Surgical consent,  Pre-op evaluation,  At surgeon's request and post-op pain management  Laterality: Upper and Left  Prep: alcohol swabs       Needles:  Injection technique: Single-shot  Needle Type: Echogenic Stimulator Needle     Needle Length: 10cm  Needle Gauge: 21   Needle insertion depth: 6 cm   Additional Needles:   Procedures: Doppler guided,,,, ultrasound used (permanent image in chart),,,,  Motor weakness within 4 minutes.  Narrative:  Start time: 01/02/2020 1:11 PM End time: 01/02/2020 1:15 PM Injection made incrementally with aspirations every 5 mL.  Performed by: Personally  Anesthesiologist: Christia Reading, MD  Additional Notes: Functioning IV was confirmed and O2 [place]/monitors were applied. Light sedation administered as required, patient responsive throughout. A 21ga EchoStim needle was used. Sterile prep and drape,hand hygiene and sterile gloves were used.  Negative aspiration and negative test dose prior to incremental administration of local anesthetic. 1% Lidocaine for skin wheal, 3 ml. Total LA: 26ml - 0.5% Ropivicaine/1:200 epi/8mg  decadron. U/S images stored in chart. The patient tolerated the procedure well.

## 2020-01-02 NOTE — Op Note (Addendum)
01/02/2020  2:49 PM  Patient:   Rachel [name]  Pre-Op Diagnosis:   Large osteochondral loose body, left elbow.  Post-Op Diagnosis:   Synovitis and osteochondral loose bodies x2, left elbow.  Procedure:   Arthroscopic partial synovectomy with excision of loose bodies x2, left elbow.  Surgeon:   Pascal Lux, MD  Assistant:   Cameron Proud, PA-C  Anesthesia:   GET  Findings:   As above.  The donor site appeared to be the inferior most portion of the lateral capitellar region which had formed a good fibrocartilaginous layer.  No significant degenerative changes were identified.  Complications:   None  EBL:   5 cc  Fluids:   800 cc crystalloid  TT:   45 minutes at 250 mmHg  Drains:   None  Closure:   4-0 Prolene interrupted sutures  IBrief Clinical Note:   The patient is a 34 year old female with a long history of intermittent catching/locking of her left elbow. 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 The patient notes that her symptoms are locking and catching has progressed more recently, prompting her to seek evaluation. X-rays and a subsequent MRI scan confirmed the presence of a large osteochondral lesion in the olecranon fossa. The patient presents at this time for a left elbow arthroscopy with debridement and excision of the osteochondral fragment.  Procedure:   The patient underwent placement of an interscalene block in the preoperative holding area by the anesthesiologist before she was brought into the operating room and lain in the supine position. After adequate IV sedation was obtained, the patient was repositioned in the lateral position and secured using a short beanbag. A tourniquet was placed around the upper arm before the arm was secured to the arm holder using a 4 inch Coban. The left upper extremity was prepped with ChloraPrep solution before being draped sterilely.  Preoperative antibiotics were administered. A timeout was performed to verify the appropriate surgical site before a total of 25 cc of 0.5% Sensorcaine with epinephrine was injected into the left elbow joint via the posterior lateral soft spot.    The limb was exsanguinated with an Esmarch and the tourniquet inflated to 250 mmHg. A proximal anteromedial portal was created using an outside-in technique at a spot 2 cm proximal to the medial epicondyle and just anterior to the intermuscular septum. The skin was incised with a #15 blade before blunt dissection was carried down through the subcutaneous tissues using a hemostat. The anterior aspect of the elbow was carefully inspected with the findings as described above. A separate anterolateral portal was created 2 cm proximal and 1 center anterior to the lateral epicondyle using an outside-in technique. The shaver was introduced and areas of synovitis debrided using the 3.5 mm full-radius resector. The instruments were then removed from the anterior compartment.  A posterolateral portal was created 3 cm proximal to the tip of the olecranon along the lateral border of the triceps tendon before the camera was inserted. A second posterolateral portal was created more distally level with the tip of the olecranon. The arthroscope was inserted through the more distal postero-lateral portal site to visualize the olecranon fossa. The large loose body was readily identified. The more proximal posterolateral portal site was extended sufficiently to enable the grasper to withdraw the large loose body and the loose body was grasped and removed successfully. A second smaller loose body was  identified partially adhered to the synovial tissues in the base of the olecranon fossa which also was removed by debriding it with the full-radius resector. Additional synovial tissues were debrided from the olecranon fossa as well as along the posterolateral gutter, removing the plica in  the process. The camera was then placed in the proximal posterolateral portal and the ulnar gutter examined. No significant synovial tissues or additional loose bodies were identified. The instruments were removed from the posterior compartment after suctioning the excess fluid.   The portal sites were reapproximated using 4-0 Prolene interrupted sutures before a sterile bulky dressings were applied to the elbow. The patient was rolled back in the supine position on operating room table before the arm was placed into a standard sling for comfort. Subsequently, the patient was awakened and returned to the recovery room in satisfactory condition after tolerating the procedure well.

## 2020-01-02 NOTE — Anesthesia Preprocedure Evaluation (Signed)
Anesthesia Evaluation  Patient identified by MRN, date of birth, ID band Patient awake    Reviewed: Allergy & Precautions, H&P , NPO status , reviewed documented beta blocker date and time   Airway Mallampati: II  TM Distance: >3 FB Neck ROM: full    Dental  (+) Teeth Intact   Pulmonary former smoker,    Pulmonary exam normal        Cardiovascular hypertension, Normal cardiovascular exam     Neuro/Psych Seizures -,  PSYCHIATRIC DISORDERS Anxiety Depression    GI/Hepatic neg GERD  ,  Endo/Other  diabetes  Renal/GU      Musculoskeletal   Abdominal   Peds  Hematology  (+) Blood dyscrasia, anemia ,   Anesthesia Other Findings Past Medical History: No date: Anemia No date: Anxiety No date: ASCUS with positive high risk HPV cervical     Comment:  HPV No date: Depression     Comment:  medicated, doing well No date: Elevated transaminase level No date: GERD (gastroesophageal reflux disease)     Comment:  with pregnancy only No date: Gestational diabetes No date: History of alcoholism (HCC) No date: History of substance abuse (HCC) 2012: Mini stroke (HCC)     Comment:  alcohol related No date: Osteoporosis No date: Pregnancy induced hypertension No date: Seizures (HCC)     Comment:  from alcohol, has been sober for over 25yrs  Past Surgical History: 01/16/2019: CESAREAN SECTION; N/A     Comment:  Procedure: CESAREAN SECTION;  Surgeon: Vick Frees, MD;  Location: [facility] LD ORS;  Service: Obstetrics;                Laterality: N/A; No date: ELBOW SURGERY; Bilateral No date: KNEE ARTHROSCOPY; Bilateral No date: TONSILLECTOMY     Reproductive/Obstetrics (+) Breast feeding                              Anesthesia Physical Anesthesia Plan  ASA: II  Anesthesia Plan: General   Post-op Pain Management:    Induction: Intravenous  PONV Risk Score and Plan: Treatment  may vary due to age or medical condition and TIVA  Airway Management Planned: Nasal Cannula and Natural Airway  Additional Equipment:   Intra-op Plan:   Post-operative Plan:   Informed Consent: I have reviewed the patients History and Physical, chart, labs and discussed the procedure including the risks, benefits and alternatives for the proposed anesthesia with the patient or authorized representative who has indicated his/her understanding and acceptance.     Dental Advisory Given  Plan Discussed with: CRNA and Surgeon  Anesthesia Plan Comments: (Discussed options regarding breastfeeding. Will proceed w supraclavicular block and mild TIVA.)        Anesthesia Quick Evaluation

## 2020-01-02 NOTE — H&P (Signed)
Paper H&P to be scanned into permanent record. H&P reviewed and patient re-examined. No changes. 

## 2020-01-02 NOTE — Discharge Instructions (Addendum)
AMBULATORY SURGERY  DISCHARGE INSTRUCTIONS   1) The drugs that you were given will stay in your system until tomorrow so for the next 24 hours you should not:  A) Drive an automobile B) Make any legal decisions C) Drink any alcoholic beverage   2) You may resume regular meals tomorrow.  Today it is better to start with liquids and gradually work up to solid foods.  You may eat anything you prefer, but it is better to start with liquids, then soup and crackers, and gradually work up to solid foods.   3) Please notify your doctor immediately if you have any unusual bleeding, trouble breathing, redness and pain at the surgery site, drainage, fever, or pain not relieved by medication.    4) Additional Instructions:        Please contact your physician with any problems or Same Day Surgery at [phone], Monday through Friday 6 am to 4 pm, or [place] at [facility] number at [phone].Orthopedic discharge instructions: Keep dressing dry and intact. Keep hand elevated above heart level. May shower after dressing removed on postop day 4 (Monday). Cover sutures with Band-Aids after drying off. Apply ice to affected area frequently. Take Aleve 2 tabs BID with meals for 7-10 days, then as necessary. Take ES Tylenol or pain medication as prescribed when needed.  Return for follow-up in 10-14 days or as scheduled.

## 2020-01-02 NOTE — Anesthesia Postprocedure Evaluation (Signed)
Anesthesia Post Note  Patient: DENISHIA CITRO  Procedure(s) Performed: LEFT ELBOW ARTHROSCOPY WITH FOREIGN BODY REMOVAL (Left Elbow)  Patient location during evaluation: PACU Anesthesia Type: General Level of consciousness: awake and alert Pain management: pain level controlled Vital Signs Assessment: post-procedure vital signs reviewed and stable Respiratory status: spontaneous breathing, nonlabored ventilation, respiratory function stable and patient connected to nasal cannula oxygen Cardiovascular status: blood pressure returned to baseline and stable Postop Assessment: no apparent nausea or vomiting Anesthetic complications: no     Last Vitals:  Vitals:   01/02/20 1554 01/02/20 1618  BP: 105/71 112/73  Pulse: 100 94  Resp: 20 20  Temp: 36.5 C (!) 36.3 C  SpO2: 97% 98%    Last Pain:  Vitals:   01/02/20 1618  TempSrc: Temporal  PainSc:                  Corinda Gubler

## 2020-01-06 LAB — SURGICAL PATHOLOGY

## 2020-03-06 ENCOUNTER — Other Ambulatory Visit: Payer: Self-pay | Admitting: Family Medicine

## 2020-06-18 ENCOUNTER — Ambulatory Visit: Payer: 59 | Admitting: Family Medicine

## 2020-06-18 ENCOUNTER — Ambulatory Visit: Payer: 59 | Admitting: Unknown Physician Specialty

## 2020-06-25 ENCOUNTER — Encounter: Payer: Self-pay | Admitting: Unknown Physician Specialty

## 2020-06-25 ENCOUNTER — Telehealth (INDEPENDENT_AMBULATORY_CARE_PROVIDER_SITE_OTHER): Payer: 59 | Admitting: Unknown Physician Specialty

## 2020-06-25 DIAGNOSIS — F331 Major depressive disorder, recurrent, moderate: Secondary | ICD-10-CM

## 2020-06-25 DIAGNOSIS — G4700 Insomnia, unspecified: Secondary | ICD-10-CM | POA: Diagnosis not present

## 2020-06-25 MED ORDER — SERTRALINE HCL 100 MG PO TABS: 100.0000 mg | ORAL_TABLET | Freq: Every day | ORAL | 1 refills | Status: DC

## 2020-06-25 MED ORDER — SERTRALINE HCL 50 MG PO TABS: 50.0000 mg | ORAL_TABLET | Freq: Every day | ORAL | 1 refills | Status: DC

## 2020-06-25 NOTE — Assessment & Plan Note (Signed)
Difficulty falling asleep.  Sleep hygeine suggested.  Consider small amount of melatonin and a less sedating antihistamine

## 2020-06-25 NOTE — Patient Instructions (Signed)

## 2020-06-25 NOTE — Assessment & Plan Note (Signed)
Stable, continue present medications.   

## 2020-06-25 NOTE — Progress Notes (Signed)
   There were no vitals taken for this visit.   Subjective:    Patient ID: Rachel [name], female    DOB: 1985-12-08, 34 y.o.   MRN: [id number]  HPI: Rachel [name] is a 34 y.o. female  Chief Complaint  Patient presents with  . Anxiety    follow up--refill   This visit was completed via telephone due to the restrictions of the COVID-19 pandemic. All issues as above were discussed and addressed but no physical exam was performed. If it was felt that the patient should be evaluated in the office, they were directed there. The patient verbally consented to this visit. Patient was unable to complete an audio/visual visit due to Technical difficulties,Lack of internet. . Location of the patient: home . Location of the provider: work . Those involved with this call:  . Provider: Gabriel Cirri, DNP . CMA: Wilhemena Durie, CMA . Front Desk/Registration: Harriet Pho  . Time spent on call: 10 minutes on the phone discussing health concerns. 10 minutes total spent in review of patient's record and preparation of their chart.  I verified patient identity using two factors (patient name and date of birth). Patient consents verbally to being seen via telemedicine visit today.   6 month f/u for 6 months.    Depression screen [facility] 2/9 06/25/2020 06/25/2020 12/17/2019 11/19/2019 07/05/2019  Decreased Interest 0 0 0 0 0  Down, Depressed, Hopeless 0 0 0 0 0  PHQ - 2 Score 0 0 0 0 0  Altered sleeping 3 1 0 1 0  Tired, decreased energy 1 0 0 1 0  Change in appetite 0 0 0 0 0  Feeling bad or failure about yourself  0 0 0 1 0  Trouble concentrating 1 1 0 0 0  Moving slowly or fidgety/restless 0 0 0 0 0  Suicidal thoughts 0 0 0 0 0  PHQ-9 Score 5 2 0 3 0  Difficult doing work/chores Somewhat difficult Not difficult at all Not difficult at all Somewhat difficult Not difficult at all  Some recent data might be hidden    Relevant past medical, surgical, family and social history reviewed and updated  as indicated. Interim medical history since our last visit reviewed. Allergies and medications reviewed and updated.  Review of Systems  Per HPI unless specifically indicated above     Objective:    There were no vitals taken for this visit.  Wt Readings from Last 3 Encounters:  07/05/19 129 lb (58.5 kg)  01/16/19 168 lb 3.2 oz (76.3 kg)  01/14/19 166 lb 14.4 oz (75.7 kg)    Physical Exam Neurological:     Mental Status: She is alert.  Psychiatric:        Mood and Affect: Mood normal.       Assessment & Plan:   Problem List Items Addressed This Visit      Unprioritized   Insomnia    Difficulty falling asleep.  Sleep hygeine suggested.  Consider small amount of melatonin and a less sedating antihistamine      Moderate episode of recurrent major depressive disorder (HCC)    Stable, continue present medications.        Relevant Medications   sertraline (ZOLOFT) 50 MG tablet   sertraline (ZOLOFT) 100 MG tablet       Follow up plan: Return in about 6 months (around 12/23/2020), or for physical.

## 2020-06-28 IMAGING — MR MR ELBOW*L* W/O CM
5 series · 40 of 40 positions shown · non-contrast
Comparison: Plain films right elbow 03/15/2014.

CLINICAL DATA: Elbow locking for 1 month. The patient feels as if
something is floating in her elbow. No known injury.

EXAM:
MRI OF THE LEFT ELBOW WITHOUT CONTRAST
TECHNIQUE: Multiplanar, multisequence MR imaging of the elbow was performed. No
intravenous contrast was administered.

[Series 3: T1 · axial · left · 3.0mm · 0.47mm/px · z∈[-29,+61]mm · 9 of 25 slices shown (1 of 2)]
[im 1/25]
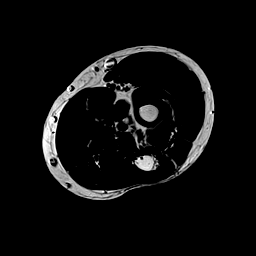
[im 4/25]
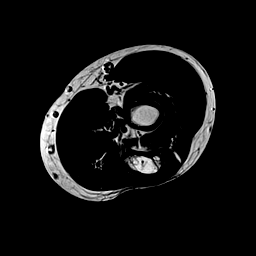
[im 7/25]
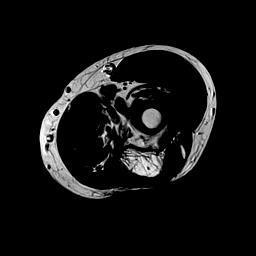
[im 10/25]
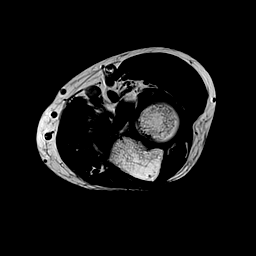
[im 13/25]
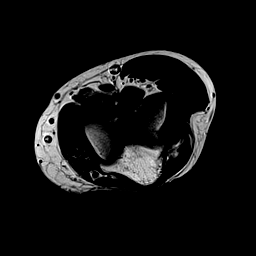
[im 16/25]
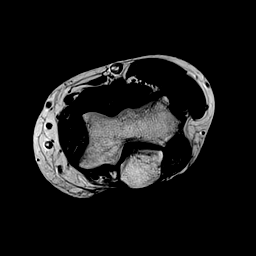
[im 19/25]
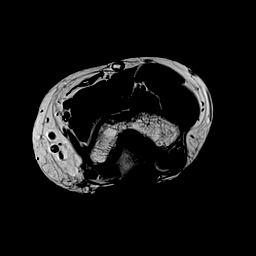
[im 22/25]
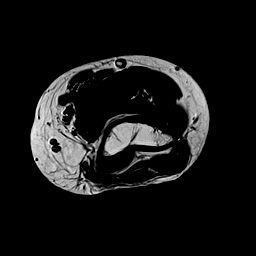
[im 25/25]
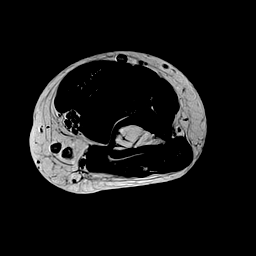

[Series 4: T2 fat-sat · axial · left · 3.0mm · 0.47mm/px · z∈[-29,+61]mm · 9 of 25 slices shown (1 of 2)]
[im 1/25]
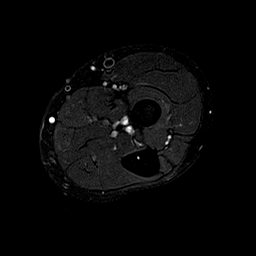
[im 4/25]
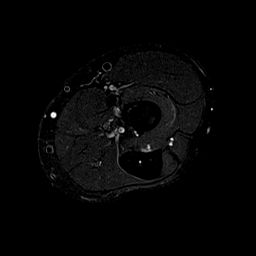
[im 7/25]
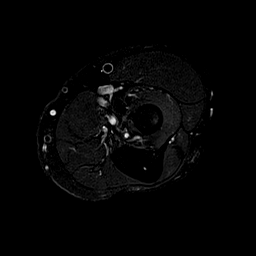
[im 10/25]
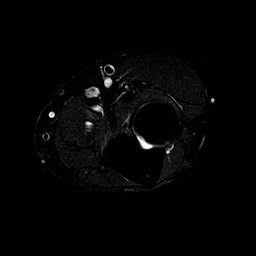
[im 13/25]
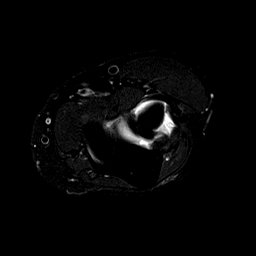
[im 16/25]
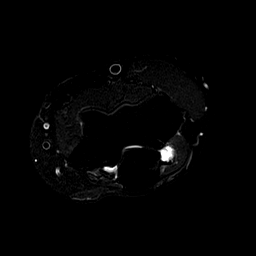
[im 19/25]
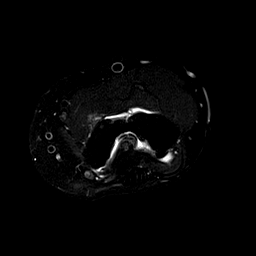
[im 22/25]
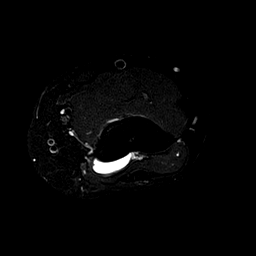
[im 25/25]
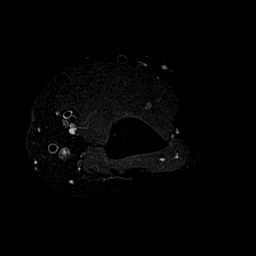

[Series 5: T2 fat-sat · coronal · left · 3.0mm · 0.55mm/px · 7 of 19 slices shown (2 of 2)]
[im 1/19]
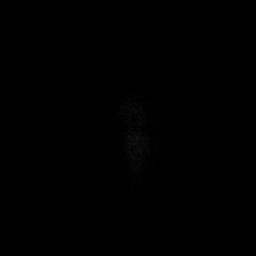
[im 4/19]
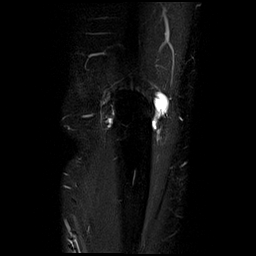
[im 7/19]
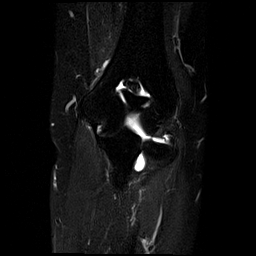
[im 10/19]
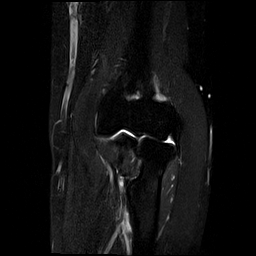
[im 13/19]
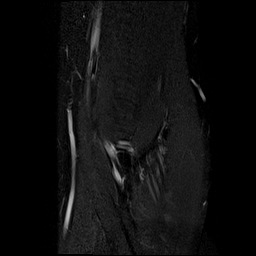
[im 16/19]
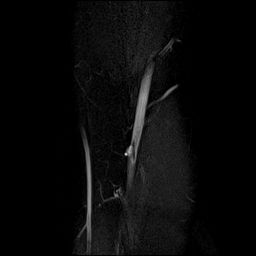
[im 19/19]
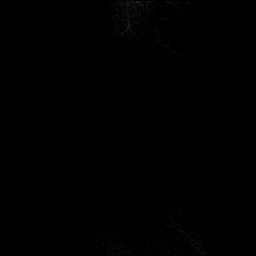

[Series 6: T1 · coronal · left · 3.0mm · 0.55mm/px · 7 of 19 slices shown (2 of 2)]
[im 1/19]
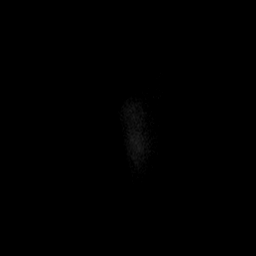
[im 4/19]
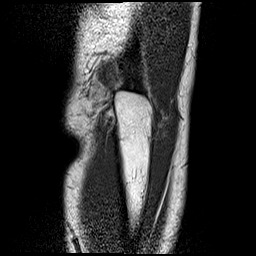
[im 7/19]
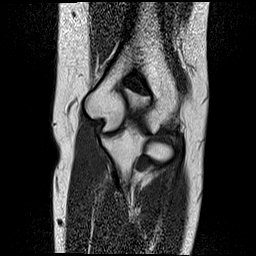
[im 10/19]
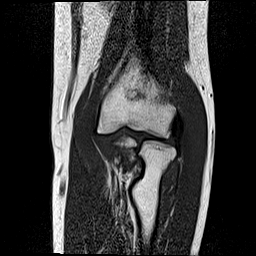
[im 13/19]
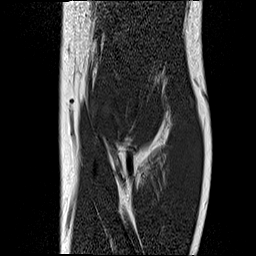
[im 16/19]
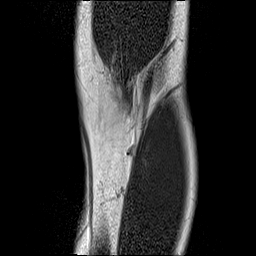
[im 19/19]
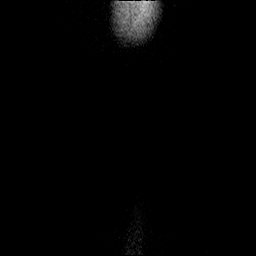

[Series 7: PD fat-sat · sagittal · left · 3.0mm · 0.55mm/px · 8 of 22 slices shown]
[im 1/22]
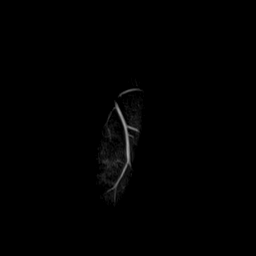
[im 4/22]
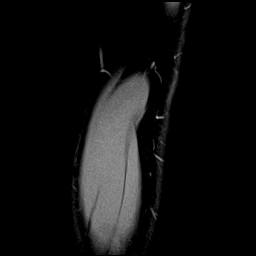
[im 7/22]
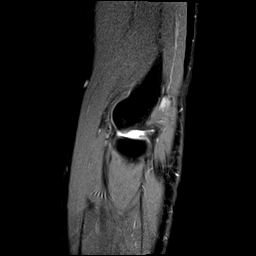
[im 10/22]
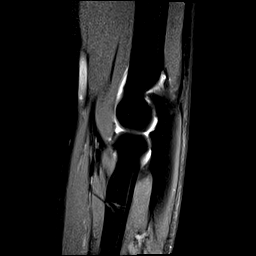
[im 13/22]
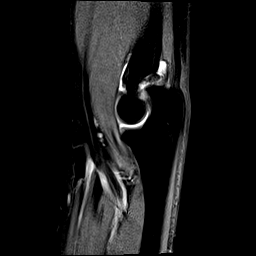
[im 16/22]
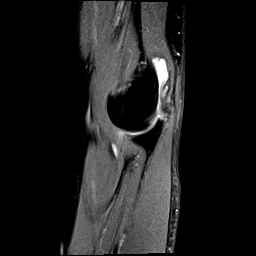
[im 19/22]
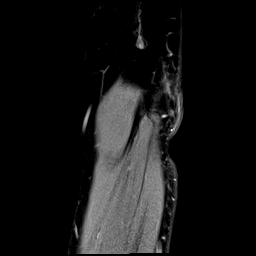
[im 22/22]
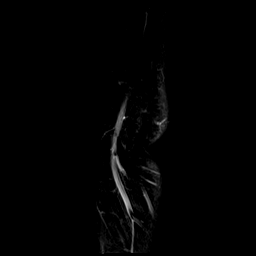

[40 of 40 positions shown; findings below may reference images not displayed]

FINDINGS: TENDONS

Common forearm flexor origin: Intact.

Common forearm extensor origin: Intact.

Biceps: Intact.

Triceps: Intact.

LIGAMENTS

Medial stabilizers: Intact

Lateral stabilizers:  Intact.

Cartilage: Preserved.

Joint: Small elbow joint effusion is identified. There is a loose
body in the olecranon of the fossa distal humerus which measures 1
cm transverse x 0.8 cm AP x 1.3 cm craniocaudal.

Cubital tunnel: Normal.

Bones: No fracture, contusion or focal lesion.
IMPRESSION: The examination is positive for a loose body in the olecranon fossa
of the humerus measuring 1.3 cm in diameter. No other abnormality is
identified.

## 2021-01-12 ENCOUNTER — Other Ambulatory Visit: Payer: Self-pay | Admitting: Unknown Physician Specialty

## 2021-02-09 ENCOUNTER — Other Ambulatory Visit: Payer: Self-pay | Admitting: Nurse Practitioner

## 2021-02-09 NOTE — Telephone Encounter (Signed)
Pharmacy requesting 90 days supply- medication sent as courtesy RF- 01/12/21. Patient needs appointment

## 2021-02-09 NOTE — Telephone Encounter (Signed)
Scheduled 5/23 has enough to last

## 2021-02-22 ENCOUNTER — Other Ambulatory Visit: Payer: Self-pay

## 2021-02-22 ENCOUNTER — Ambulatory Visit: Payer: 59 | Admitting: Nurse Practitioner

## 2021-02-22 ENCOUNTER — Encounter: Payer: Self-pay | Admitting: Nurse Practitioner

## 2021-02-22 VITALS — BP 113/75 | HR 82 | Temp 98.2°F | Wt 135.6 lb

## 2021-02-22 DIAGNOSIS — F331 Major depressive disorder, recurrent, moderate: Secondary | ICD-10-CM

## 2021-02-22 DIAGNOSIS — O24419 Gestational diabetes mellitus in pregnancy, unspecified control: Secondary | ICD-10-CM | POA: Diagnosis not present

## 2021-02-22 DIAGNOSIS — G4700 Insomnia, unspecified: Secondary | ICD-10-CM | POA: Diagnosis not present

## 2021-02-22 MED ORDER — SERTRALINE HCL 50 MG PO TABS: 50.0000 mg | ORAL_TABLET | Freq: Every day | ORAL | 0 refills | Status: DC

## 2021-02-22 MED ORDER — SERTRALINE HCL 100 MG PO TABS: 100.0000 mg | ORAL_TABLET | Freq: Every day | ORAL | 1 refills | Status: DC

## 2021-02-22 MED ORDER — HYDROXYZINE HCL 25 MG PO TABS: 12.5000 mg | ORAL_TABLET | Freq: Three times a day (TID) | ORAL | 0 refills | Status: DC | PRN

## 2021-02-22 NOTE — Assessment & Plan Note (Signed)
Stable, continue present medications. Patient feels like she is doing well despite GAD7 score.  Discussed that if she would like to increase medication to Zoloft 200mg  that can be done.  Follow up in 6 months or sooner if concerns arise.

## 2021-02-22 NOTE — Progress Notes (Signed)
BP 113/75   Pulse 82   Temp 98.2 F (36.8 C) (Oral)   Wt 135 lb 9.6 oz (61.5 kg)   LMP 02/19/2021   SpO2 98%   BMI 26.48 kg/m    Subjective:    Patient ID: Rachel [name], female    DOB: Oct 03, 1986, 35 y.o.   MRN: [id number]  HPI: Rachel [name] is a 35 y.o. female  Chief Complaint  Patient presents with  . Medication Refill  . Depression  . Anxiety    DEPRESSION Patient states she feels like things are going well.  She works from home and keeps her son at home.  This causes her stress. Denies SI. Depression screen [facility] 2/9 02/22/2021 06/25/2020 06/25/2020 12/17/2019 11/19/2019  Decreased Interest 0 0 0 0 0  Down, Depressed, Hopeless 0 0 0 0 0  PHQ - 2 Score 0 0 0 0 0  Altered sleeping 0 3 1 0 1  Tired, decreased energy 1 1 0 0 1  Change in appetite 0 0 0 0 0  Feeling bad or failure about yourself  0 0 0 0 1  Trouble concentrating 1 1 1  0 0  Moving slowly or fidgety/restless 0 0 0 0 0  Suicidal thoughts 0 0 0 0 0  PHQ-9 Score 2 5 2  0 3  Difficult doing work/chores - Somewhat difficult Not difficult at all Not difficult at all Somewhat difficult  Some recent data might be hidden   GAD 7 : Generalized Anxiety Score 02/22/2021 06/25/2020 12/17/2019 11/19/2019  Nervous, Anxious, on Edge 1 1 0 2  Control/stop worrying 3 0 0 2  Worry too much - different things 2 0 0 1  Trouble relaxing 3 2 0 2  Restless 0 0 0 0  Easily annoyed or irritable 3 1 0 2  Afraid - awful might happen 0 1 0 1  Total GAD 7 Score 12 5 0 10  Anxiety Difficulty - Somewhat difficult Not difficult at all Somewhat difficult   Patient feels like she is waking up nauseous like she was when she was pregnant and had gestational diabetes. Patient states she eats something with just carbohydrates makes her light headed and needs to eat something with protein. Patient states she is always craving water. Denies polyuria.   Relevant past medical, surgical, family and social history reviewed and updated as indicated.  Interim medical history since our last visit reviewed. Allergies and medications reviewed and updated.  Review of Systems  Gastrointestinal: Positive for nausea.  Endocrine: Positive for polydipsia. Negative for polyuria.  Psychiatric/Behavioral: Positive for dysphoric mood. Negative for suicidal ideas. The patient is nervous/anxious.     Per HPI unless specifically indicated above     Objective:    BP 113/75   Pulse 82   Temp 98.2 F (36.8 C) (Oral)   Wt 135 lb 9.6 oz (61.5 kg)   LMP 02/19/2021   SpO2 98%   BMI 26.48 kg/m   Wt Readings from Last 3 Encounters:  02/22/21 135 lb 9.6 oz (61.5 kg)  07/05/19 129 lb (58.5 kg)  01/16/19 168 lb 3.2 oz (76.3 kg)    Physical Exam Vitals and nursing note reviewed.  Constitutional:      General: She is not in acute distress.    Appearance: Normal appearance. She is normal weight. She is not ill-appearing, toxic-appearing or diaphoretic.  HENT:     Head: Normocephalic.     Right Ear: External ear normal.  Left Ear: External ear normal.     Nose: Nose normal.     Mouth/Throat:     Mouth: Mucous membranes are moist.     Pharynx: Oropharynx is clear.  Eyes:     General:        Right eye: No discharge.        Left eye: No discharge.     Extraocular Movements: Extraocular movements intact.     Conjunctiva/sclera: Conjunctivae normal.     Pupils: Pupils are equal, round, and reactive to light.  Cardiovascular:     Rate and Rhythm: Normal rate and regular rhythm.     Heart sounds: No murmur heard.   Pulmonary:     Effort: Pulmonary effort is normal. No respiratory distress.     Breath sounds: Normal breath sounds. No wheezing or rales.  Musculoskeletal:     Cervical back: Normal range of motion and neck supple.  Skin:    General: Skin is warm and dry.     Capillary Refill: Capillary refill takes less than 2 seconds.  Neurological:     General: No focal deficit present.     Mental Status: She is alert and oriented to  person, place, and time. Mental status is at baseline.  Psychiatric:        Mood and Affect: Mood normal.        Behavior: Behavior normal.        Thought Content: Thought content normal.        Judgment: Judgment normal.     Results for orders placed or performed during the hospital encounter of 01/02/20  Glucose, capillary  Result Value Ref Range   Glucose-Capillary 70 70 - 99 mg/dL  Pregnancy, urine POC  Result Value Ref Range   Preg Test, Ur NEGATIVE NEGATIVE  Surgical pathology  Result Value Ref Range   SURGICAL PATHOLOGY      SURGICAL PATHOLOGY CASE: [id number] PATIENT: Rayvn Degroff Surgical Pathology Report     Specimen Submitted: A. Loose body, left elbow  Clinical History: Loose body of left elbow., osteochondral defect., left elbow pain.      DIAGNOSIS: A. LOOSE BODY, LEFT ELBOW; EXCISION: - BENIGN CARTILAGE AND BONE. - NEGATIVE FOR MALIGNANCY.   GROSS DESCRIPTION: A. Labeled: Loose body left elbow Received: Formalin Tissue fragment(s): 1 Size: 1.5 x 1.0 x 0.5 cm Description: Received is a fragment of pale-tan, cartilaginous and bony tissue.  Serially sectioned. Entirely submitted in cassette 1 (following decalcification).    Final Diagnosis performed by Georgeanna Harrison, MD.   Electronically signed 01/06/2020 10:02:54AM The electronic signature indicates that the named Attending Pathologist has evaluated the specimen Technical component performed at [facility], [address] [place], [place] [address] Lab: [phone] Dir: Jolene Schimke, MD, MMM  Professional compon ent performed at [facility], [facility], [address] [place], [place], [place] [address] Lab: [phone] Dir: Georgiann Cocker. Rubinas, MD       Assessment & Plan:   Problem List Items Addressed This Visit      Other   Moderate episode of recurrent major depressive disorder (HCC)   Insomnia - Primary       Follow up plan: No follow-ups on file.

## 2021-02-23 LAB — COMPREHENSIVE METABOLIC PANEL
ALT: 19 IU/L (ref 0–32)
AST: 18 IU/L (ref 0–40)
Albumin/Globulin Ratio: 2.1 (ref 1.2–2.2)
Albumin: 4.6 g/dL (ref 3.8–4.8)
Alkaline Phosphatase: 80 IU/L (ref 44–121)
BUN/Creatinine Ratio: 25 — ABNORMAL HIGH (ref 9–23)
BUN: 18 mg/dL (ref 6–20)
Bilirubin Total: <0.2 mg/dL (ref 0.0–1.2)
CO2: 23 mmol/L (ref 20–29)
Calcium: 9.7 mg/dL (ref 8.7–10.2)
Chloride: 102 mmol/L (ref 96–106)
Creatinine, Ser: 0.72 mg/dL (ref 0.57–1.00)
Globulin, Total: 2.2 g/dL (ref 1.5–4.5)
Glucose: 110 mg/dL — ABNORMAL HIGH (ref 65–99)
Potassium: 4.1 mmol/L (ref 3.5–5.2)
Sodium: 139 mmol/L (ref 134–144)
Total Protein: 6.8 g/dL (ref 6.0–8.5)
eGFR: 112 mL/min/{1.73_m2} (ref >59)

## 2021-02-23 LAB — HEMOGLOBIN A1C
Est. average glucose Bld gHb Est-mCnc: 100 mg/dL
Hgb A1c MFr Bld: 5.1 % (ref 4.8–5.6)

## 2021-02-23 NOTE — Progress Notes (Signed)
Hi Rachel [name].  Your lab work looks good.  No evidence of diabetes. Your glucose is elevated likely because you weren't fasting.  We will monitor this at future visits.  Let me know if you have any questions.

## 2021-02-24 ENCOUNTER — Other Ambulatory Visit: Payer: Self-pay | Admitting: Nurse Practitioner

## 2021-02-24 NOTE — Telephone Encounter (Signed)
Requested medication (s) are due for refill today- yes  Requested medication (s) are on the active medication list -yes  Future visit scheduled -yes  Last refill: 02/22/2021(100 mg dose)  Notes to clinic: Rx RF request- partial refill at visit- patient needs 50 mg to make 150 mg dosing in chart- sent for review.  Requested Prescriptions  Pending Prescriptions Disp Refills   sertraline (ZOLOFT) 50 MG tablet [Pharmacy Med Name: SERTRALINE HCL 50 MG TABLET] 30 tablet 0    Sig: TAKE 1 TABLET (50 MG TOTAL) BY MOUTH DAILY. TAKE ALONGSIDE 100 MG TAB TO MAKE 150 MG DOSE      Psychiatry:  Antidepressants - SSRI Passed - 02/24/2021  9:02 AM      Passed - Completed PHQ-2 or PHQ-9 in the last 360 days      Passed - Valid encounter within last 6 months    Recent Outpatient Visits           2 days ago Moderate episode of recurrent major depressive disorder (HCC)   [facility] Larae Grooms, NP   8 months ago Moderate episode of recurrent major depressive disorder (HCC)   [facility] Gabriel Cirri, NP   1 year ago Anxiety   [facility] Roosvelt Maser [place], [place]   1 year ago Anxiety   [facility] Roosvelt Maser [place], [place]   1 year ago Moderate episode of recurrent major depressive disorder [facility])   [facility] Particia Nearing, PA-C       Future Appointments             In 5 months Larae Grooms, NP [facility], [facility]                 Requested Prescriptions  Pending Prescriptions Disp Refills   sertraline (ZOLOFT) 50 MG tablet [Pharmacy Med Name: SERTRALINE HCL 50 MG TABLET] 30 tablet 0    Sig: TAKE 1 TABLET (50 MG TOTAL) BY MOUTH DAILY. TAKE ALONGSIDE 100 MG TAB TO MAKE 150 MG DOSE      Psychiatry:  Antidepressants - SSRI Passed - 02/24/2021  9:02 AM      Passed - Completed PHQ-2 or PHQ-9 in the last 360 days      Passed - Valid encounter within last 6 months    Recent Outpatient  Visits           2 days ago Moderate episode of recurrent major depressive disorder (HCC)   [facility] Larae Grooms, NP   8 months ago Moderate episode of recurrent major depressive disorder [facility])   [facility] Gabriel Cirri, NP   1 year ago Anxiety   [facility] Roosvelt Maser [place], [place]   1 year ago Anxiety   [facility] Roosvelt Maser [place], [place]   1 year ago Moderate episode of recurrent major depressive disorder [facility])   [facility] Particia Nearing, [place]       Future Appointments             In 5 months Larae Grooms, NP [facility], [facility]

## 2021-02-24 NOTE — Telephone Encounter (Signed)
Scheduled 11/18

## 2021-05-30 ENCOUNTER — Other Ambulatory Visit: Payer: Self-pay | Admitting: Nurse Practitioner

## 2021-05-30 NOTE — Telephone Encounter (Signed)
Requested Prescriptions  Pending Prescriptions Disp Refills  . hydrOXYzine (ATARAX/VISTARIL) 25 MG tablet [Pharmacy Med Name: HYDROXYZINE HCL 25 MG TABLET] 30 tablet 0    Sig: TAKE 0.5-1 TABLETS (12.5-25 MG TOTAL) BY MOUTH 3 (THREE) TIMES DAILY AS NEEDED.     Ear, Nose, and Throat:  Antihistamines Passed - 05/30/2021  2:55 PM      Passed - Valid encounter within last 12 months    Recent Outpatient Visits          3 months ago Moderate episode of recurrent major depressive disorder (HCC)   [facility] Larae Grooms, NP   11 months ago Moderate episode of recurrent major depressive disorder (HCC)   [facility] Gabriel Cirri, NP   1 year ago Anxiety   [facility] Roosvelt Maser [place], [place]   1 year ago Anxiety   [facility] Roosvelt Maser [place], [place]   1 year ago Moderate episode of recurrent major depressive disorder [facility])   [facility] Particia Nearing, [place]      Future Appointments            In 2 months Larae Grooms, NP [facility], [facility]

## 2021-07-08 ENCOUNTER — Ambulatory Visit: Payer: 59 | Admitting: Nurse Practitioner

## 2021-08-18 NOTE — Progress Notes (Signed)
BP (!) 93/59   Pulse 76   Temp 98.4 F (36.9 C) (Oral)   Ht 5' 0.9" (1.547 m)   Wt 143 lb 6.4 oz (65 kg)   LMP 08/03/2021 (Approximate)   SpO2 97%   BMI 27.18 kg/m    Subjective:    Patient ID: Rachel [name], female    DOB: 12-12-1985, 35 y.o.   MRN: [id number]  HPI: Rachel [name] is a 35 y.o. female  Chief Complaint  Patient presents with   Depression   Weight Gain    Pt states she has recently gained 12 pounds and does not know why. States she had gestational diabetes and wonders if she could be prediabetic    DEPRESSION Patient states her depression is well controlled.  Does not think the weight gain is related to her depression.  [place] Office Visit from 08/19/2021 in [place]  PHQ-9 Total Score 1      WEIGHT GAIN Patient states she has gained 12lbs over the last 2 months.  She had gestational diabetes during pregnancy wondering if she is now diabetic.  She does have some fatigue due to not sleeping well.  Denies other symptoms associated with the weight gain.  States eating habits are the same.  She checked her blood sugar and it was running in the 90s.  Relevant past medical, surgical, family and social history reviewed and updated as indicated. Interim medical history since our last visit reviewed. Allergies and medications reviewed and updated.  Review of Systems  Constitutional:  Positive for appetite change and fatigue.   Per HPI unless specifically indicated above     Objective:    BP (!) 93/59   Pulse 76   Temp 98.4 F (36.9 C) (Oral)   Ht 5' 0.9" (1.547 m)   Wt 143 lb 6.4 oz (65 kg)   LMP 08/03/2021 (Approximate)   SpO2 97%   BMI 27.18 kg/m   Wt Readings from Last 3 Encounters:  08/19/21 143 lb 6.4 oz (65 kg)  02/22/21 135 lb 9.6 oz (61.5 kg)  07/05/19 129 lb (58.5 kg)    Physical Exam Vitals and nursing note reviewed.  Constitutional:      General: She is not in acute distress.    Appearance: Normal appearance.  She is normal weight. She is not ill-appearing, toxic-appearing or diaphoretic.  HENT:     Head: Normocephalic.     Right Ear: External ear normal.     Left Ear: External ear normal.     Nose: Nose normal.     Mouth/Throat:     Mouth: Mucous membranes are moist.     Pharynx: Oropharynx is clear.  Eyes:     General:        Right eye: No discharge.        Left eye: No discharge.     Extraocular Movements: Extraocular movements intact.     Conjunctiva/sclera: Conjunctivae normal.     Pupils: Pupils are equal, round, and reactive to light.  Cardiovascular:     Rate and Rhythm: Normal rate and regular rhythm.     Heart sounds: No murmur heard. Pulmonary:     Effort: Pulmonary effort is normal. No respiratory distress.     Breath sounds: Normal breath sounds. No wheezing or rales.  Musculoskeletal:     Cervical back: Normal range of motion and neck supple.  Skin:    General: Skin is warm and dry.     Capillary Refill:  Capillary refill takes less than 2 seconds.  Neurological:     General: No focal deficit present.     Mental Status: She is alert and oriented to person, place, and time. Mental status is at baseline.  Psychiatric:        Mood and Affect: Mood normal.        Behavior: Behavior normal.        Thought Content: Thought content normal.        Judgment: Judgment normal.    Results for orders placed or performed in visit on 02/22/21  Comp Met (CMET)  Result Value Ref Range   Glucose 110 (H) 65 - 99 mg/dL   BUN 18 6 - 20 mg/dL   Creatinine, Ser 0.72 0.57 - 1.00 mg/dL   eGFR 112 >59 mL/min/1.73   BUN/Creatinine Ratio 25 (H) 9 - 23   Sodium 139 134 - 144 mmol/L   Potassium 4.1 3.5 - 5.2 mmol/L   Chloride 102 96 - 106 mmol/L   CO2 23 20 - 29 mmol/L   Calcium 9.7 8.7 - 10.2 mg/dL   Total Protein 6.8 6.0 - 8.5 g/dL   Albumin 4.6 3.8 - 4.8 g/dL   Globulin, Total 2.2 1.5 - 4.5 g/dL   Albumin/Globulin Ratio 2.1 1.2 - 2.2   Bilirubin Total <0.2 0.0 - 1.2 mg/dL    Alkaline Phosphatase 80 44 - 121 IU/L   AST 18 0 - 40 IU/L   ALT 19 0 - 32 IU/L  HgB A1c  Result Value Ref Range   Hgb A1c MFr Bld 5.1 4.8 - 5.6 %   Est. average glucose Bld gHb Est-mCnc 100 mg/dL      Assessment & Plan:   Problem List Items Addressed This Visit   None Visit Diagnoses     Weight gain    -  Primary   Will draw lab work at visit today. Will rule out diabetes. Will make furthe recommendations based on lab results.    Relevant Orders   Comp Met (CMET)   CBC w/Diff   HgB A1c   B12   Vitamin D (25 hydroxy)   TSH   Estradiol   Testosterone   Other fatigue       Labs ordered today.  Will make recommendations based on lab results.    Relevant Orders   Comp Met (CMET)   CBC w/Diff   HgB A1c   B12   Vitamin D (25 hydroxy)   TSH   Estradiol   Testosterone        Follow up plan: No follow-ups on file.

## 2021-08-19 ENCOUNTER — Other Ambulatory Visit: Payer: Self-pay

## 2021-08-19 ENCOUNTER — Encounter: Payer: Self-pay | Admitting: Nurse Practitioner

## 2021-08-19 ENCOUNTER — Ambulatory Visit: Payer: 59 | Admitting: Nurse Practitioner

## 2021-08-19 VITALS — BP 93/59 | HR 76 | Temp 98.4°F | Ht 60.9 in | Wt 143.4 lb

## 2021-08-19 DIAGNOSIS — R5383 Other fatigue: Secondary | ICD-10-CM

## 2021-08-19 DIAGNOSIS — R635 Abnormal weight gain: Secondary | ICD-10-CM

## 2021-08-20 ENCOUNTER — Encounter: Payer: 59 | Admitting: Nurse Practitioner

## 2021-08-20 LAB — CBC WITH DIFFERENTIAL/PLATELET
Basophils Absolute: 0.1 10*3/uL (ref 0.0–0.2)
Basos: 1 %
EOS (ABSOLUTE): 0.1 10*3/uL (ref 0.0–0.4)
Eos: 1 %
Hematocrit: 42 % (ref 34.0–46.6)
Hemoglobin: 14 g/dL (ref 11.1–15.9)
Immature Grans (Abs): 0 10*3/uL (ref 0.0–0.1)
Immature Granulocytes: 0 %
Lymphocytes Absolute: 1.8 10*3/uL (ref 0.7–3.1)
Lymphs: 29 %
MCH: 28.5 pg (ref 26.6–33.0)
MCHC: 33.3 g/dL (ref 31.5–35.7)
MCV: 86 fL (ref 79–97)
Monocytes Absolute: 0.4 10*3/uL (ref 0.1–0.9)
Monocytes: 6 %
Neutrophils Absolute: 4.1 10*3/uL (ref 1.4–7.0)
Neutrophils: 63 %
Platelets: 263 10*3/uL (ref 150–450)
RBC: 4.91 x10E6/uL (ref 3.77–5.28)
RDW: 12.9 % (ref 11.7–15.4)
WBC: 6.4 10*3/uL (ref 3.4–10.8)

## 2021-08-20 LAB — COMPREHENSIVE METABOLIC PANEL
ALT: 16 IU/L (ref 0–32)
AST: 17 IU/L (ref 0–40)
Albumin/Globulin Ratio: 2.1 (ref 1.2–2.2)
Albumin: 4.7 g/dL (ref 3.8–4.8)
Alkaline Phosphatase: 71 IU/L (ref 44–121)
BUN/Creatinine Ratio: 20 (ref 9–23)
BUN: 13 mg/dL (ref 6–20)
Bilirubin Total: 0.3 mg/dL (ref 0.0–1.2)
CO2: 23 mmol/L (ref 20–29)
Calcium: 9.8 mg/dL (ref 8.7–10.2)
Chloride: 103 mmol/L (ref 96–106)
Creatinine, Ser: 0.65 mg/dL (ref 0.57–1.00)
Globulin, Total: 2.2 g/dL (ref 1.5–4.5)
Glucose: 84 mg/dL (ref 70–99)
Potassium: 4.3 mmol/L (ref 3.5–5.2)
Sodium: 140 mmol/L (ref 134–144)
Total Protein: 6.9 g/dL (ref 6.0–8.5)
eGFR: 118 mL/min/{1.73_m2} (ref >59)

## 2021-08-20 LAB — VITAMIN B12: Vitamin B-12: 470 pg/mL (ref 232–1245)

## 2021-08-20 LAB — ESTRADIOL: Estradiol: 56.7 pg/mL

## 2021-08-20 LAB — TESTOSTERONE: Testosterone: 4 ng/dL — ABNORMAL LOW (ref 8–60)

## 2021-08-20 LAB — HEMOGLOBIN A1C
Est. average glucose Bld gHb Est-mCnc: 103 mg/dL
Hgb A1c MFr Bld: 5.2 % (ref 4.8–5.6)

## 2021-08-20 LAB — VITAMIN D 25 HYDROXY (VIT D DEFICIENCY, FRACTURES): Vit D, 25-Hydroxy: 37.4 ng/mL (ref 30.0–100.0)

## 2021-08-20 LAB — TSH: TSH: 1.41 u[IU]/mL (ref 0.450–4.500)

## 2021-08-20 NOTE — Progress Notes (Signed)
Hi Rachel [name].  Your labs are unremarkable.  Although your testosterone is low it is not low to the point of needing a supplement.  Your A1c is within normal range not prediabetic.  I recommend you exercise regularly to see if that will help with weight loss.  If you would like a referral to a nutritionist I can place that referral.

## 2021-08-22 ENCOUNTER — Other Ambulatory Visit: Payer: Self-pay | Admitting: Nurse Practitioner

## 2021-08-22 NOTE — Telephone Encounter (Signed)
Last RF 02/22/21 #90 1 RF Overdue for office visit.  30 day courtesy refill given with note to pharmacy for pt to call for appointment. Message sent via MyChart to call and make appointment.  Requested Prescriptions  Pending Prescriptions Disp Refills   sertraline (ZOLOFT) 100 MG tablet [Pharmacy Med Name: SERTRALINE HCL 100 MG TABLET] 30 tablet 0    Sig: TAKE 1 TABLET BY MOUTH EVERY DAY     Psychiatry:  Antidepressants - SSRI Passed - 08/22/2021 12:44 AM      Passed - Completed PHQ-2 or PHQ-9 in the last 360 days      Passed - Valid encounter within last 6 months    Recent Outpatient Visits           3 days ago Weight gain   [facility] Larae Grooms, NP   6 months ago Moderate episode of recurrent major depressive disorder (HCC)   [facility] Larae Grooms, NP   1 year ago Moderate episode of recurrent major depressive disorder (HCC)   [facility] Gabriel Cirri, NP   1 year ago Anxiety   [facility] Roosvelt Maser [place], [place]   1 year ago Anxiety   [facility] Roosvelt Maser [place], [place]

## 2021-08-23 MED ORDER — SERTRALINE HCL 50 MG PO TABS: 50.0000 mg | ORAL_TABLET | Freq: Every day | ORAL | 1 refills | Status: DC

## 2021-08-23 MED ORDER — HYDROXYZINE HCL 25 MG PO TABS
12.5000 mg | ORAL_TABLET | Freq: Three times a day (TID) | ORAL | 0 refills | Status: DC | PRN
Start: 2021-08-23 — End: 2022-01-14

## 2021-08-23 NOTE — Telephone Encounter (Signed)
Called patient to schedule appointment for medication refill.  Pt was seen by PCP 11/17 but was not provided a refill.  She has asked if provider can refill both medications (if possible without another appointment) because she was just seen and can not afford another copay.  Nurse triage sent in courtesy 30 day refill.  Medications requested are sertraline and hydroxyzine.

## 2021-08-23 NOTE — Telephone Encounter (Signed)
Medication sent to the pharmacy.

## 2021-11-03 DIAGNOSIS — O09523 Supervision of elderly multigravida, third trimester: Secondary | ICD-10-CM

## 2021-11-26 LAB — HM PAP SMEAR

## 2021-11-30 ENCOUNTER — Encounter: Payer: Self-pay | Admitting: Nurse Practitioner

## 2021-12-01 MED ORDER — SERTRALINE HCL 100 MG PO TABS: 100.0000 mg | ORAL_TABLET | Freq: Every day | ORAL | 1 refills | Status: DC

## 2021-12-01 MED ORDER — SERTRALINE HCL 50 MG PO TABS: 50.0000 mg | ORAL_TABLET | Freq: Every day | ORAL | 1 refills | Status: DC

## 2022-01-14 ENCOUNTER — Encounter: Payer: Self-pay | Admitting: Nurse Practitioner

## 2022-01-14 ENCOUNTER — Telehealth: Payer: Self-pay | Admitting: Nurse Practitioner

## 2022-01-14 MED ORDER — HYDROXYZINE HCL 25 MG PO TABS: 12.5000 mg | ORAL_TABLET | Freq: Three times a day (TID) | ORAL | 0 refills | Status: DC | PRN

## 2022-01-14 MED ORDER — SERTRALINE HCL 50 MG PO TABS: 50.0000 mg | ORAL_TABLET | Freq: Every day | ORAL | 1 refills | Status: DC

## 2022-01-14 MED ORDER — SERTRALINE HCL 100 MG PO TABS: 100.0000 mg | ORAL_TABLET | Freq: Every day | ORAL | 1 refills | Status: DC

## 2022-01-14 NOTE — Telephone Encounter (Signed)
error 

## 2022-01-17 ENCOUNTER — Ambulatory Visit: Payer: Self-pay | Admitting: *Deleted

## 2022-01-17 NOTE — Telephone Encounter (Signed)
Pharmacist from [facility] for clarification on the pregnancy status of this pt. ? ? ?Reason for Disposition ? [1] Pharmacy calling with prescription question AND [2] triager unable to answer question ? ?Answer Assessment - Initial Assessment Questions ?1. NAME of MEDICATION: "What medicine are you calling about?" ?    Pharmacist from [facility] [phone] called ?2. QUESTION: "What is your question?" (e.g., double dose of medicine, side effect) ?    She is trying to determine if this pt is pregnant and if so does Aura Dials, NP still want these 3 prescriptions dispensed.  ? ?The 3 prescriptions are sertraline 50 mg, sertraline 100 mg and hydroxyzine 25 mg. ?3. PRESCRIBING HCP: "Who prescribed it?" Reason: if prescribed by specialist, call should be referred to that group. ?    Aura Dials, NP ?4. SYMPTOMS: "Do you have any symptoms?" ?    Is this pt pregnant?   They tried to contact pt once without a response. ?5. SEVERITY: If symptoms are present, ask "Are they mild, moderate or severe?" ?    N/A ?6. PREGNANCY:  "Is there any chance that you are pregnant?" "When was your last menstrual period?" ?    Pharmacy needing to know if this pt is pregnant.  When call pharmacy back ask to speak with a pharmacist because they work remotely and that's how to get in contact with a pharmacist. ? ?Protocols used: Medication Question Call-A-AH ? ?

## 2022-01-17 NOTE — Telephone Encounter (Signed)
Spoke pharmacist from [employer] and informed pharmacist that per Jon Billings, NP patient is currently pregnant and OK to take current medications in questions (Sertraline 50 mg, Sertraline 100 mg and Hydroxyzine 25 mg). Pharmacist verbalized understanding and has no further questions at this time.  ?

## 2022-03-31 DIAGNOSIS — O24410 Gestational diabetes mellitus in pregnancy, diet controlled: Secondary | ICD-10-CM | POA: Diagnosis present

## 2022-05-17 NOTE — H&P (Signed)
OB History & Physical   History of Present Illness:  Chief Complaint: here for repeat c-section  HPI:  Rachel [name] is a 36 y.o. G34P1011 female at [redacted]w[redacted]d dated by LMP consistent with 9 week ultrasound.  Her pregnancy has been complicated by GDMA1 and history of c-section.    She denies contractions.   She denies leakage of fluid.   She denies vaginal bleeding.   She denies fetal movement.    Total weight gain for pregnancy: 11.9 kg (26 lb 3.2 oz)   Obstetrical Problem List: Pregnancy Problems (from 11/26/21 to present)     Problem Noted Resolved   Previous cesarean section 03/16/2022 by Kathaleen Grinder No   History of gestational diabetes mellitus (GDM) 03/16/2022 by Kathaleen Grinder No   History of gestational hypertension 03/16/2022 by Kathaleen Grinder No   Elevated glucose tolerance test 03/16/2022 by Kathaleen Grinder No   Encounter for supervision of multigravida of advanced maternal age in third trimester 11/03/2021 by Launa Flight, CMA No   Overview Addendum 05/11/2022  1:54 PM by Gillian Scarce, CMA    36 y.o. G2P0010 at  Patient's last menstrual period was 09/01/2021 (approximate). consistent with  with ultrasound on 11/03/21 @ 9.0wks .Estimated Date of Delivery: 06/08/22 Sex of baby and name:  "Maggie Dare"   FOB:     Thayer Ohm Factors complicating this pregnancy  Maternal Age >18 yo Age at delivery:  15 Genetic Screening: declined EFW and AFI at 36-37 weeks   Previous C/S x 1 C/s done for: NRFHR- LTCS completed on  01/16/20 by Dr. Amado Nash Delivery preference: desires repeat LTCS  Scheduled for 8/30 with SDJ H/o mental health diagnoses Dx: Depression Medications prior to pregnancy:Zoloft 150 mg Medical team following JAS:[facility]) Medications during pregnancy:150 mg daily Hx of Alcoholism- sober X8 years Hx of GHTN Normal BP at amenorrhea Started baby Aspirin 11/26/21 - 01/20/22 confirmed she is taking Hx of GDMA1  with diagnosis of GDMA1 this preg by BG log -  Early screen: Early GTT 143 3 hour GTT, 83, 164, 191, 138 WNL 28 weeks: 1hr GTT 158 3hr GCT - BG log shows she likely has GDMA1  Screening results and needs: NOB:  Medicaid Questionnaire: n/a []  ACHD Program Depression Score: 4 MBT:A positive   Ab screen: Neg   HIV: neg  RPR:NR    Hep B:Neg   Hep C:Neg  Pap:11/2021 neg/neg G/C: neg/neg Rubella: immune    TSH:0.960  HgA1C: 5.0 Aneuploidy:  First trimester:  MaternitT21:  Declined 11/26/21   Second trimester (AFP/tetra): declined 11/26/21 28 weeks:  Review Medicaid Questionnaire:n/a []  ACHD Program Depression Score: 5 Blood consent: signed 03/16/22 Hgb: 12.1   Platelets: 212   Glucola: 158   Rhogam: N/A 36 weeks:  GBS:   G/C: neg/neg  Hgb: 12.5  Platelets: 213   HIV: neg RPR: NR    Last :  11/03/21: [place]terverted, single Viable IUP, S=2.28 CM =9.0wks. FHT=170 bpm. YS and amnion imaged. Cx long and closed=4.08 CM. Bilateral Ovs imaged, appear wnl  01/20/22: SINGLE GESTATION SEEN  , FHR  147  BPM, VERTEX, POST PLAC, NORMAL ANATOMY SEEN  Immunization:   Flu in season -  Tdap at 27-36 weeks -Given 03/16/22. PL Covid-19 -  Contraception Plan: husband getting vasectomy Feeding Plan: breast  Labor Plans: repeat c-section with SDJ           Maternal Medical History:   Past Medical History:  Diagnosis Date  Abnormal cytology    Allergic state    ASCUS with positive high risk HPV cervical 02/19/2015   Depression    History of alcoholism (CMS-HCC)    sober for 2 years   Osteoporosis, post-menopausal    Substance abuse (CMS-HCC)     Past Surgical History:  Procedure Laterality Date   CESAREAN SECTION  01/2019   Arthroscopic partial synovectomy with excision of loose bodies x2, left elbow. Left 01/02/2020   Dr. Joice Lofts   Bilateral Elbow- bone chips removed     KNEE ARTHROSCOPY Bilateral    TONSILLECTOMY      Allergies  Allergen Reactions   Amoxicillin  Shortness Of Breath   Penicillins Rash and Shortness Of Breath    Did it involve swelling of the face/tongue/throat, SOB, or low BP? Yes Did it involve sudden or severe rash/hives, skin peeling, or any reaction on the inside of your mouth or nose? Yes Did you need to seek medical attention at a hospital or doctor's office? Yes When did it last happen?   13 years ago    If all above answers are "NO", may proceed with cephalosporin use.     Prior to Admission medications   Medication Sig Taking? Last Dose  blood glucose diagnostic test strip 1 each (1 strip total) 3 (three) times daily Use as instructed. Yes Taking  hydrOXYzine (ATARAX) 25 MG tablet Take 0.5-1 mg by mouth 3 (three) times daily as needed Yes Taking  pantoprazole (PROTONIX) 40 MG DR tablet Take 1 tablet (40 mg total) by mouth once daily Yes Taking  prenatal vitamin with iron-folic acid (PRENATAL TABLETS) tablet Take 1 tablet by mouth once daily Yes Taking  sertraline (ZOLOFT) 50 MG tablet Take 1 tablet (50 mg total) by mouth daily. Take alongside 100 mg tab to make 150 mg dose Yes Taking    OB History  Gravida Para Term Preterm AB Living  3 1 1   1 1   SAB IAB Ectopic Molar Multiple Live Births    1            # Outcome Date GA Lbr Len/2nd Weight Sex Delivery Anes PTL Lv  3 Current           2 Term 01/2019 [redacted]w[redacted]d   M CS-Unspec     1 IAB             Prenatal care site: [facility]  Social History: She  reports that she has quit smoking. She has never used smokeless tobacco. She reports that she does not drink alcohol and does not use drugs.  Family History: family history includes Alcohol abuse in her father and mother; Depression in her mother; Obesity in her paternal aunt; Skin cancer in her mother; Substance Abuse in her father and mother.   Review of Systems:  Review of Systems  Constitutional: Negative.   HENT: Negative.    Eyes: Negative.   Respiratory: Negative.    Cardiovascular: Negative.    Gastrointestinal: Negative.   Genitourinary: Negative.   Musculoskeletal: Negative.   Skin: Negative.   Neurological: Negative.   Endo/Heme/Allergies: Negative.   Psychiatric/Behavioral: Negative.      Physical Exam:  BP 120/82   Pulse 90   Ht 152.4 cm (5')   Wt 79 kg (174 lb 3.2 oz)   LMP 09/01/2021 (Approximate)   BMI 34.02 kg/m   Physical Exam Constitutional:      General: She is not in acute distress.    Appearance: Normal appearance.  HENT:     Head: Normocephalic and atraumatic.  Eyes:     General: No scleral icterus.    Conjunctiva/sclera: Conjunctivae normal.  Cardiovascular:     Rate and Rhythm: Normal rate and regular rhythm.     Heart sounds: No murmur heard.   No friction rub. No gallop.  Pulmonary:     Effort: Pulmonary effort is normal. No respiratory distress.     Breath sounds: Normal breath sounds. No wheezing, rhonchi or rales.  Abdominal:     General: Bowel sounds are normal. There is no distension.     Palpations: Abdomen is soft. There is mass (gravid, NT).     Tenderness: There is no abdominal tenderness. There is no guarding or rebound.  Musculoskeletal:        General: No swelling. Normal range of motion.  Neurological:     General: No focal deficit present.     Mental Status: She is oriented to person, place, and time.     Cranial Nerves: No cranial nerve deficit.  Skin:    General: Skin is warm and dry.     Findings: No lesion.  Psychiatric:        Mood and Affect: Mood normal.        Behavior: Behavior normal.        Judgment: Judgment normal.  Vitals and nursing note reviewed.     Assessment:  Rachel [name] is a 36 y.o. G42P1011 female at [redacted]w[redacted]d with history of c-section, desires repeat.   Plan:  Admit to Labor & Delivery  CBC, T&S, NPO, IVF GBS negative.   To OR for c-section   Clare Charon, MD 05/17/2022 12:27 PM

## 2022-05-24 ENCOUNTER — Encounter
Admission: RE | Admit: 2022-05-24 | Discharge: 2022-05-24 | Disposition: A | Discharge status: Home / Self Care | Payer: 59 | Source: Ambulatory Visit | Attending: Obstetrics and Gynecology | Admitting: Obstetrics and Gynecology

## 2022-05-24 ENCOUNTER — Encounter: Payer: Self-pay | Admitting: *Deleted

## 2022-05-24 ENCOUNTER — Other Ambulatory Visit: Payer: Self-pay

## 2022-05-24 NOTE — Patient Instructions (Addendum)
Your procedure is scheduled on: June 01, 2022 [facility] PLEASE CALL LABOR AND DELIVERY May 31, 2022 TUESDAY AT PHONE NUMBER [phone] TO FIND OUT YOUR ARRIVAL TIME.  REMEMBER: Instructions that are not followed completely may result in serious medical risk, up to and including death; or upon the discretion of your surgeon and anesthesiologist your surgery may need to be rescheduled.  Do not eat OR drink after midnight the night before surgery.    TAKE THESE MEDICATIONS THE MORNING OF SURGERY WITH A SIP OF WATER: Zoloft  No aspirin day of the surgery.  One week prior to surgery: Stop Anti-inflammatories (NSAIDS) such as Advil, Aleve, Ibuprofen, Motrin, Naproxen, Naprosyn and Aspirin based products such as Excedrin, Goodys Powder, BC Powder. Stop ANY OVER THE COUNTER supplements until after surgery. You may however, continue to take Tylenol if needed for pain up until the day of surgery.  No Alcohol for 24 hours before or after surgery.  No Smoking including e-cigarettes for 24 hours prior to surgery.  No chewable tobacco products for at least 6 hours prior to surgery.  No nicotine patches on the day of surgery.  Do not use any "recreational" drugs for at least a week prior to your surgery.  Please be advised that the combination of cocaine and anesthesia may have negative outcomes, up to and including death. If you test positive for cocaine, your surgery will be cancelled.  On the morning of surgery brush your teeth with toothpaste and water, you may rinse your mouth with mouthwash if you wish. Do not swallow any toothpaste or mouthwash.  Use CHG wipes as directed on instruction sheet.  Do not wear jewelry, make-up, hairpins, clips or nail polish.  Do not wear lotions, powders, or perfumes or deodorant  Do not shave body from the neck down 48 hours prior to surgery just in case you cut yourself which could leave a site for infection.  Also, freshly shaved skin may  become irritated if using the CHG soap.  Contact lenses, hearing aids and dentures may not be worn into surgery.  Do not bring valuables to the hospital. [facility] is not responsible for any missing/lost belongings or valuables.   Notify your doctor if there is any change in your medical condition (cold, fever, infection).  Wear comfortable clothing (specific to your surgery type) to the hospital.  After surgery, you can help prevent lung complications by doing breathing exercises.  Take deep breaths and cough every 1-2 hours. Your doctor may order a device called an Incentive Spirometer to help you take deep breaths. When coughing or sneezing, hold a pillow firmly against your incision with both hands. This is called "splinting." Doing this helps protect your incision. It also decreases belly discomfort.  If you are being admitted to the hospital overnight, leave your suitcase in the car. After surgery it may be brought to your room.  Please call the Pre-admissions Testing Dept. at [phone] if you have any questions about these instructions.  Surgery Visitation Policy:  Patients undergoing a surgery or procedure may have two family members or support persons with them as long as the person is not COVID-19 positive or experiencing its symptoms.   Inpatient Visitation:    Visiting hours are 7 a.m. to 8 p.m. Up to four visitors are allowed at one time in a patient room, including children. The visitors may rotate out with other people during the day. One designated support person (adult) may remain overnight.

## 2022-05-30 ENCOUNTER — Encounter: Payer: Self-pay | Admitting: Urgent Care

## 2022-05-30 ENCOUNTER — Encounter
Admission: RE | Admit: 2022-05-30 | Discharge: 2022-05-30 | Disposition: A | Discharge status: Home / Self Care | Payer: 59 | Source: Ambulatory Visit | Attending: Obstetrics and Gynecology | Admitting: Obstetrics and Gynecology

## 2022-05-30 DIAGNOSIS — Z3A00 Weeks of gestation of pregnancy not specified: Secondary | ICD-10-CM | POA: Insufficient documentation

## 2022-05-30 DIAGNOSIS — Z98891 History of uterine scar from previous surgery: Secondary | ICD-10-CM | POA: Insufficient documentation

## 2022-05-30 DIAGNOSIS — O0993 Supervision of high risk pregnancy, unspecified, third trimester: Secondary | ICD-10-CM | POA: Insufficient documentation

## 2022-05-30 DIAGNOSIS — Z01812 Encounter for preprocedural laboratory examination: Secondary | ICD-10-CM | POA: Insufficient documentation

## 2022-05-30 LAB — TYPE AND SCREEN
ABO/RH(D): A POS
Antibody Screen: NEGATIVE
Extend sample reason: UNDETERMINED

## 2022-05-30 LAB — RAPID HIV SCREEN (HIV 1/2 AB+AG)
HIV 1/2 Antibodies: NONREACTIVE
HIV-1 P24 Antigen - HIV24: NONREACTIVE

## 2022-05-30 LAB — CBC
HCT: 37 % (ref 36.0–46.0)
Hemoglobin: 12.3 g/dL (ref 12.0–15.0)
MCH: 27.5 pg (ref 26.0–34.0)
MCHC: 33.2 g/dL (ref 30.0–36.0)
MCV: 82.6 fL (ref 80.0–100.0)
Platelets: 200 10*3/uL (ref 150–400)
RBC: 4.48 MIL/uL (ref 3.87–5.11)
RDW: 12.7 % (ref 11.5–15.5)
WBC: 7.2 10*3/uL (ref 4.0–10.5)
nRBC: 0 % (ref 0.0–0.2)

## 2022-05-31 LAB — RPR: RPR Ser Ql: NONREACTIVE

## 2022-05-31 MED ORDER — CEFAZOLIN SODIUM-DEXTROSE 2-4 GM/100ML-% IV SOLN
2.0000 g | INTRAVENOUS | Status: AC
  Administered 2022-06-01: 2 g via INTRAVENOUS
  Filled 2022-05-31: qty 100

## 2022-05-31 MED ORDER — BUPIVACAINE HCL (PF) 0.5 % IJ SOLN
5.0000 mL | Freq: Once | INTRAMUSCULAR | Status: DC
  Filled 2022-05-31: qty 10

## 2022-05-31 MED ORDER — SOD CITRATE-CITRIC ACID 500-334 MG/5ML PO SOLN
30.0000 mL | ORAL | Status: AC
  Administered 2022-06-01: 30 mL via ORAL

## 2022-05-31 MED ORDER — BUPIVACAINE 0.25 % ON-Q PUMP DUAL CATH 400 ML
400.0000 mL | INJECTION | Status: DC
  Filled 2022-05-31: qty 400

## 2022-06-01 ENCOUNTER — Encounter: Payer: Self-pay | Admitting: Obstetrics and Gynecology

## 2022-06-01 ENCOUNTER — Other Ambulatory Visit: Payer: Self-pay

## 2022-06-01 ENCOUNTER — Inpatient Hospital Stay
Admission: RE | Admit: 2022-06-01 | Discharge: 2022-06-04 | DRG: 787 | Disposition: A | Discharge status: Home / Self Care | Payer: 59 | Attending: Obstetrics and Gynecology | Admitting: Obstetrics and Gynecology

## 2022-06-01 ENCOUNTER — Encounter
Admission: RE | Disposition: A | Discharge status: Home / Self Care | Payer: Self-pay | Source: Home / Self Care | Attending: Obstetrics and Gynecology

## 2022-06-01 ENCOUNTER — Inpatient Hospital Stay: Payer: 59 | Admitting: Anesthesiology

## 2022-06-01 DIAGNOSIS — Z880 Allergy status to penicillin: Secondary | ICD-10-CM | POA: Diagnosis not present

## 2022-06-01 DIAGNOSIS — Z3A39 39 weeks gestation of pregnancy: Secondary | ICD-10-CM

## 2022-06-01 DIAGNOSIS — F32A Depression, unspecified: Secondary | ICD-10-CM | POA: Diagnosis present

## 2022-06-01 DIAGNOSIS — O09523 Supervision of elderly multigravida, third trimester: Secondary | ICD-10-CM

## 2022-06-01 DIAGNOSIS — Z87891 Personal history of nicotine dependence: Secondary | ICD-10-CM

## 2022-06-01 DIAGNOSIS — O24410 Gestational diabetes mellitus in pregnancy, diet controlled: Secondary | ICD-10-CM | POA: Diagnosis present

## 2022-06-01 DIAGNOSIS — O9081 Anemia of the puerperium: Secondary | ICD-10-CM | POA: Diagnosis not present

## 2022-06-01 DIAGNOSIS — Z98891 History of uterine scar from previous surgery: Secondary | ICD-10-CM

## 2022-06-01 DIAGNOSIS — O34211 Maternal care for low transverse scar from previous cesarean delivery: Secondary | ICD-10-CM | POA: Diagnosis present

## 2022-06-01 DIAGNOSIS — O24420 Gestational diabetes mellitus in childbirth, diet controlled: Secondary | ICD-10-CM | POA: Diagnosis present

## 2022-06-01 DIAGNOSIS — D62 Acute posthemorrhagic anemia: Secondary | ICD-10-CM | POA: Diagnosis not present

## 2022-06-01 DIAGNOSIS — O0993 Supervision of high risk pregnancy, unspecified, third trimester: Principal | ICD-10-CM

## 2022-06-01 DIAGNOSIS — O99344 Other mental disorders complicating childbirth: Secondary | ICD-10-CM | POA: Diagnosis present

## 2022-06-01 LAB — GLUCOSE, CAPILLARY
Glucose-Capillary: 131 mg/dL — ABNORMAL HIGH (ref 70–99)
Glucose-Capillary: 117 mg/dL — ABNORMAL HIGH (ref 70–99)

## 2022-06-01 SURGERY — Surgical Case
Anesthesia: Spinal

## 2022-06-01 MED ORDER — MORPHINE SULFATE (PF) 0.5 MG/ML IJ SOLN
INTRAMUSCULAR | Status: DC | PRN
  Administered 2022-06-01: .1 mg via INTRATHECAL

## 2022-06-01 MED ORDER — FENTANYL CITRATE (PF) 100 MCG/2ML IJ SOLN
INTRAMUSCULAR | Status: DC | PRN
Start: 2022-06-01 — End: 2022-06-01
  Administered 2022-06-01: 15 ug via INTRATHECAL

## 2022-06-01 MED ORDER — LACTATED RINGERS IV SOLN: INTRAVENOUS | Status: DC

## 2022-06-01 MED ORDER — ONDANSETRON HCL 4 MG/2ML IJ SOLN
INTRAMUSCULAR | Status: DC | PRN
  Administered 2022-06-01: 4 mg via INTRAVENOUS

## 2022-06-01 MED ORDER — EPHEDRINE 5 MG/ML INJ
INTRAVENOUS | Status: AC
  Filled 2022-06-01: qty 5

## 2022-06-01 MED ORDER — WITCH HAZEL-GLYCERIN EX PADS: 1.0000 | MEDICATED_PAD | CUTANEOUS | Status: DC | PRN

## 2022-06-01 MED ORDER — KETOROLAC TROMETHAMINE 30 MG/ML IJ SOLN
30.0000 mg | Freq: Four times a day (QID) | INTRAMUSCULAR | Status: AC | PRN
  Administered 2022-06-01: 30 mg via INTRAVENOUS
  Filled 2022-06-01: qty 1

## 2022-06-01 MED ORDER — FERROUS SULFATE 325 (65 FE) MG PO TABS
325.0000 mg | ORAL_TABLET | Freq: Two times a day (BID) | ORAL | Status: DC
  Administered 2022-06-01 – 2022-06-04 (×6): 325 mg via ORAL
  Filled 2022-06-01 (×6): qty 1

## 2022-06-01 MED ORDER — OXYTOCIN-SODIUM CHLORIDE 30-0.9 UT/500ML-% IV SOLN
INTRAVENOUS | Status: AC
  Administered 2022-06-01: 2.5 [IU]/h via INTRAVENOUS
  Filled 2022-06-01: qty 500

## 2022-06-01 MED ORDER — PHENYLEPHRINE 80 MCG/ML (10ML) SYRINGE FOR IV PUSH (FOR BLOOD PRESSURE SUPPORT)
PREFILLED_SYRINGE | INTRAVENOUS | Status: AC
  Filled 2022-06-01: qty 10

## 2022-06-01 MED ORDER — EPHEDRINE SULFATE-NACL 50-0.9 MG/10ML-% IV SOSY
PREFILLED_SYRINGE | INTRAVENOUS | Status: DC | PRN
  Administered 2022-06-01: 10 mg via INTRAVENOUS

## 2022-06-01 MED ORDER — ORAL CARE MOUTH RINSE: 15.0000 mL | Freq: Once | OROMUCOSAL | Status: AC

## 2022-06-01 MED ORDER — OXYTOCIN-SODIUM CHLORIDE 30-0.9 UT/500ML-% IV SOLN
2.5000 [IU]/h | INTRAVENOUS | Status: AC
  Filled 2022-06-01: qty 500

## 2022-06-01 MED ORDER — ACETAMINOPHEN 500 MG PO TABS
1000.0000 mg | ORAL_TABLET | Freq: Four times a day (QID) | ORAL | Status: AC
  Administered 2022-06-01 – 2022-06-02 (×4): 1000 mg via ORAL
  Filled 2022-06-01 (×4): qty 2

## 2022-06-01 MED ORDER — SENNOSIDES-DOCUSATE SODIUM 8.6-50 MG PO TABS
2.0000 | ORAL_TABLET | ORAL | Status: DC
  Administered 2022-06-01 – 2022-06-04 (×3): 2 via ORAL
  Filled 2022-06-01 (×3): qty 2

## 2022-06-01 MED ORDER — DIBUCAINE (PERIANAL) 1 % EX OINT: 1.0000 | TOPICAL_OINTMENT | CUTANEOUS | Status: DC | PRN

## 2022-06-01 MED ORDER — PRENATAL MULTIVITAMIN CH
1.0000 | ORAL_TABLET | Freq: Every day | ORAL | Status: DC
  Administered 2022-06-01 – 2022-06-04 (×4): 1 via ORAL
  Filled 2022-06-01 (×4): qty 1

## 2022-06-01 MED ORDER — BUPIVACAINE HCL (PF) 0.5 % IJ SOLN
INTRAMUSCULAR | Status: DC | PRN
  Administered 2022-06-01: 10 mL

## 2022-06-01 MED ORDER — MENTHOL 3 MG MT LOZG: 1.0000 | LOZENGE | OROMUCOSAL | Status: DC | PRN

## 2022-06-01 MED ORDER — FENTANYL CITRATE (PF) 100 MCG/2ML IJ SOLN
INTRAMUSCULAR | Status: AC
  Filled 2022-06-01: qty 2

## 2022-06-01 MED ORDER — OXYCODONE-ACETAMINOPHEN 5-325 MG PO TABS
2.0000 | ORAL_TABLET | ORAL | Status: DC | PRN
  Administered 2022-06-03 – 2022-06-04 (×4): 2 via ORAL
  Filled 2022-06-01 (×4): qty 2

## 2022-06-01 MED ORDER — NALOXONE HCL 4 MG/10ML IJ SOLN
1.0000 ug/kg/h | INTRAVENOUS | Status: DC | PRN
  Filled 2022-06-01: qty 5

## 2022-06-01 MED ORDER — KETOROLAC TROMETHAMINE 30 MG/ML IJ SOLN: 30.0000 mg | Freq: Four times a day (QID) | INTRAMUSCULAR | Status: AC | PRN

## 2022-06-01 MED ORDER — COCONUT OIL OIL: 1.0000 | TOPICAL_OIL | Status: DC | PRN

## 2022-06-01 MED ORDER — NALOXONE HCL 0.4 MG/ML IJ SOLN: 0.4000 mg | INTRAMUSCULAR | Status: DC | PRN

## 2022-06-01 MED ORDER — SERTRALINE HCL 100 MG PO TABS
100.0000 mg | ORAL_TABLET | Freq: Every day | ORAL | Status: DC
  Administered 2022-06-02 – 2022-06-04 (×3): 100 mg via ORAL
  Filled 2022-06-01 (×3): qty 1

## 2022-06-01 MED ORDER — BUPIVACAINE IN DEXTROSE 0.75-8.25 % IT SOLN
INTRATHECAL | Status: DC | PRN
  Administered 2022-06-01: 1.5 mL via INTRATHECAL

## 2022-06-01 MED ORDER — MORPHINE SULFATE (PF) 0.5 MG/ML IJ SOLN
INTRAMUSCULAR | Status: AC
  Filled 2022-06-01: qty 10

## 2022-06-01 MED ORDER — PHENYLEPHRINE HCL-NACL 20-0.9 MG/250ML-% IV SOLN
INTRAVENOUS | Status: DC | PRN
  Administered 2022-06-01: 50 ug/min via INTRAVENOUS

## 2022-06-01 MED ORDER — SERTRALINE HCL 25 MG PO TABS
50.0000 mg | ORAL_TABLET | Freq: Every day | ORAL | Status: DC
  Administered 2022-06-02 – 2022-06-04 (×3): 50 mg via ORAL
  Filled 2022-06-01 (×3): qty 2

## 2022-06-01 MED ORDER — PHENYLEPHRINE HCL-NACL 20-0.9 MG/250ML-% IV SOLN
INTRAVENOUS | Status: AC
  Filled 2022-06-01: qty 250

## 2022-06-01 MED ORDER — OXYTOCIN-SODIUM CHLORIDE 30-0.9 UT/500ML-% IV SOLN
INTRAVENOUS | Status: DC | PRN
  Administered 2022-06-01: 400 mL via INTRAVENOUS

## 2022-06-01 MED ORDER — CHLORHEXIDINE GLUCONATE 0.12 % MT SOLN
15.0000 mL | Freq: Once | OROMUCOSAL | Status: AC
  Administered 2022-06-01: 15 mL via OROMUCOSAL
  Filled 2022-06-01: qty 15

## 2022-06-01 MED ORDER — PHENYLEPHRINE HCL (PRESSORS) 10 MG/ML IV SOLN
INTRAVENOUS | Status: DC | PRN
  Administered 2022-06-01: 80 ug via INTRAVENOUS

## 2022-06-01 MED ORDER — SODIUM CHLORIDE 0.9% FLUSH: 3.0000 mL | INTRAVENOUS | Status: DC | PRN

## 2022-06-01 MED ORDER — ONDANSETRON HCL 4 MG/2ML IJ SOLN: 4.0000 mg | Freq: Three times a day (TID) | INTRAMUSCULAR | Status: DC | PRN

## 2022-06-01 MED ORDER — IBUPROFEN 600 MG PO TABS
600.0000 mg | ORAL_TABLET | Freq: Four times a day (QID) | ORAL | Status: DC
  Administered 2022-06-02 – 2022-06-04 (×9): 600 mg via ORAL
  Filled 2022-06-01 (×9): qty 1

## 2022-06-01 MED ORDER — SOD CITRATE-CITRIC ACID 500-334 MG/5ML PO SOLN
ORAL | Status: AC
  Filled 2022-06-01: qty 15

## 2022-06-01 MED ORDER — SCOPOLAMINE 1 MG/3DAYS TD PT72
1.0000 | MEDICATED_PATCH | Freq: Once | TRANSDERMAL | Status: AC
  Administered 2022-06-01: 1.5 mg via TRANSDERMAL
  Filled 2022-06-01: qty 1

## 2022-06-01 MED ORDER — KETOROLAC TROMETHAMINE 30 MG/ML IJ SOLN
INTRAMUSCULAR | Status: DC | PRN
  Administered 2022-06-01: 30 mg via INTRAVENOUS

## 2022-06-01 MED ORDER — HYDROXYZINE HCL 25 MG PO TABS
12.5000 mg | ORAL_TABLET | Freq: Three times a day (TID) | ORAL | Status: DC | PRN
  Administered 2022-06-01: 25 mg via ORAL
  Administered 2022-06-01: 12.5 mg via ORAL
  Filled 2022-06-01 (×2): qty 1

## 2022-06-01 MED ORDER — SIMETHICONE 80 MG PO CHEW
80.0000 mg | CHEWABLE_TABLET | Freq: Three times a day (TID) | ORAL | Status: DC
  Administered 2022-06-01 – 2022-06-04 (×9): 80 mg via ORAL
  Filled 2022-06-01 (×9): qty 1

## 2022-06-01 MED ORDER — OXYCODONE-ACETAMINOPHEN 5-325 MG PO TABS
1.0000 | ORAL_TABLET | ORAL | Status: DC | PRN
  Administered 2022-06-03: 1 via ORAL
  Filled 2022-06-01: qty 1

## 2022-06-01 SURGICAL SUPPLY — 32 items
CATH KIT ON-Q SILVERSOAK 5 (CATHETERS) ×2 IMPLANT
CATH KIT ON-Q SILVERSOAK 5IN (CATHETERS) ×2 IMPLANT
DERMABOND ADVANCED (GAUZE/BANDAGES/DRESSINGS) ×1
DERMABOND ADVANCED .7 DNX12 (GAUZE/BANDAGES/DRESSINGS) ×1 IMPLANT
DRSG OPSITE POSTOP 4X10 (GAUZE/BANDAGES/DRESSINGS) ×1 IMPLANT
DRSG TELFA 3X8 NADH STRL (GAUZE/BANDAGES/DRESSINGS) ×1 IMPLANT
ELECT CAUTERY BLADE 6.4 (BLADE) ×1 IMPLANT
ELECT REM PT RETURN 9FT ADLT (ELECTROSURGICAL) ×1
ELECTRODE REM PT RTRN 9FT ADLT (ELECTROSURGICAL) ×1 IMPLANT
GAUZE SPONGE 4X4 12PLY STRL (GAUZE/BANDAGES/DRESSINGS) ×1 IMPLANT
GLOVE BIO SURGEON STRL SZ7 (GLOVE) ×1 IMPLANT
GLOVE SURG UNDER LTX SZ7.5 (GLOVE) ×1 IMPLANT
GOWN STRL REUS W/ TWL LRG LVL3 (GOWN DISPOSABLE) ×3 IMPLANT
GOWN STRL REUS W/TWL LRG LVL3 (GOWN DISPOSABLE) ×3
MANIFOLD NEPTUNE II (INSTRUMENTS) ×1 IMPLANT
MAT PREVALON FULL STRYKER (MISCELLANEOUS) ×1 IMPLANT
NS IRRIG 1000ML POUR BTL (IV SOLUTION) ×1 IMPLANT
PACK C SECTION AR (MISCELLANEOUS) ×1 IMPLANT
PAD OB MATERNITY 4.3X12.25 (PERSONAL CARE ITEMS) ×2 IMPLANT
PAD PREP 24X41 OB/GYN DISP (PERSONAL CARE ITEMS) ×1 IMPLANT
SCRUB CHG 4% DYNA-HEX 4OZ (MISCELLANEOUS) ×1 IMPLANT
STRIP CLOSURE SKIN 1/2X4 (GAUZE/BANDAGES/DRESSINGS) ×1 IMPLANT
SUT MNCRL 4-0 (SUTURE) ×1
SUT MNCRL 4-0 27XMFL (SUTURE) ×1
SUT PDS AB 1 TP1 96 (SUTURE) ×1 IMPLANT
SUT PLAIN GUT 0 (SUTURE) IMPLANT
SUT VIC AB 0 CTX 36 (SUTURE) ×3
SUT VIC AB 0 CTX36XBRD ANBCTRL (SUTURE) ×2 IMPLANT
SUTURE MNCRL 4-0 27XMF (SUTURE) ×1 IMPLANT
SWABSTK COMLB BENZOIN TINCTURE (MISCELLANEOUS) ×1 IMPLANT
TRAP FLUID SMOKE EVACUATOR (MISCELLANEOUS) ×1 IMPLANT
WATER STERILE IRR 500ML POUR (IV SOLUTION) ×1 IMPLANT

## 2022-06-01 NOTE — Anesthesia Procedure Notes (Signed)
Spinal  Patient location during procedure: OR Start time: 06/01/2022 7:51 AM End time: 06/01/2022 7:53 AM Reason for block: surgical anesthesia Staffing Performed: resident/CRNA  Anesthesiologist: Darrin Nipper, MD Resident/CRNA: Aline Brochure, CRNA Performed by: Aline Brochure, CRNA Authorized by: Darrin Nipper, MD   Preanesthetic Checklist Completed: patient identified, IV checked, site marked, risks and benefits discussed, surgical consent, monitors and equipment checked, pre-op evaluation and timeout performed Spinal Block Patient position: sitting Prep: ChloraPrep Patient monitoring: heart rate, continuous pulse ox, blood pressure and cardiac monitor Approach: midline Location: L3-4 Injection technique: single-shot Needle Needle type: Introducer and Pencan  Needle gauge: 24 G Needle length: 9 cm Assessment Sensory level: T10 Events: CSF return Additional Notes Sterile aseptic technique used throughout the procedure.  Negative paresthesia. Negative blood return. Positive free-flowing CSF. Expiration date of kit checked and confirmed. Patient tolerated procedure well, without complications.

## 2022-06-01 NOTE — Op Note (Signed)
Cesarean Section Operative Note    Patient Name: Rachel [name]  Date of Birth: [birth year]  MRN: [id number]  Date of Surgery: 06/01/2022   Pre-operative Diagnosis:  1) History of cesarean delivery, desires repeat 2) Diet controlled gestational diabetes in antepartum, third trimester 3) intrauterine pregnancy at [redacted]w[redacted]d   Post-operative Diagnosis:  1) History of cesarean delivery, desires repeat 2) Diet controlled gestational diabetes in antepartum, third trimester 3) intrauterine pregnancy at [redacted]w[redacted]d    Procedure: Repeat Low Transverse Cesarean Section via Pfannenstiel incision with double-layer uterine closure.   Surgeon: Moishe Spice) and Role:    * Conard Novak, MD - Primary   Assistants: Heloise Ochoa, CNM; No other capable assistant available, in surgery requiring high level assistant.  Anesthesia: spinal   Findings:  1) normal appearing gravid uterus, fallopian tubes, and ovaries 2) viable female infant with weight and APGARs pending. 3) moderate meconium with staining of the membranes   Quantified Blood Loss: 575 mL  Total IV Fluids: 1,300 ml   Urine Output:  50 mL clear urine at end of case  Specimens: none  Complications: no complications  Disposition: PACU - hemodynamically stable.   Maternal Condition: stable   Baby condition / location:  NICU due to initial respiratory issues requiring support  Procedure Details:  The patient was seen in the Holding Room. The risks, benefits, complications, treatment options, and expected outcomes were discussed with the patient. The patient concurred with the proposed plan, giving informed consent. identified as Rachel [name] and the procedure verified as C-Section Delivery. A Time Out was held and the above information confirmed.   After induction of anesthesia, the patient was draped and prepped in the usual sterile manner. A Pfannenstiel incision was made and carried down through the subcutaneous tissue to the fascia.  Fascial incision was made and extended transversely. The fascia was separated from the underlying rectus tissue superiorly and inferiorly. The peritoneum was identified and entered. Peritoneal incision was extended longitudinally. The bladder flap was bluntly and sharply freed from the lower uterine segment. A low transverse uterine incision was made and the hysterotomy was extended with cranial-caudal tension. Delivered from cephalic presentation was a Living newborn female infant with Apgar scores pending. Cord ph was not sent the umbilical cord was clamped and cut cord blood was not obtained for evaluation. The placenta was removed Intact and appeared normal with the exception of meconium staining. The uterine outline, tubes and ovaries appeared normal. The uterine incision was closed with running locked sutures of 0 Vicryl.  A second layer of the same suture was thrown in an imbricating fashion.  Hemostasis was assured.  The uterus was returned to the abdomen and the paracolic gutters were cleared of all clots and debris.  The rectus muscles were inspected and found to be hemostatic.  The On-Q catheter pumps were inserted in accordance with the manufacturer's recommendations.  The catheters were inserted approximately 4cm cephelad to the incision line, approximately 1cm apart, straddling the midline.  They were inserted to a depth of the 4th mark. They were positioned superficial to the rectus abdominus muscles and deep to the rectus fascia.    The fascia was then reapproximated with running sutures of 1-0 PDS, looped. The subcuticular closure was performed using 4-0 monocryl. The skin closure was reinforced using surgical skin glue.  The On-Q catheters were bolused with 5 mL of 0.5% marcaine plain for a total of 10 mL.  The catheters were affixed to the skin with surgical  skin glue, steri-strips, and tegaderm.    The surgical assistant performed tissue retraction, assistance with suturing, and fundal  pressure.  Instrument, sponge, and needle counts were correct prior the abdominal closure and were correct at the conclusion of the case.  The patient received Ancef 2 gram IV prior to skin incision (within 30 minutes). For VTE prophylaxis she was wearing SCDs throughout the case.  The assistant surgeon was a CNM due to lack of availability of another Sales promotion account executive.    Signed: Conard Novak, MD 06/01/2022 9:08 AM

## 2022-06-01 NOTE — Discharge Summary (Signed)
Postpartum Discharge Summary    Patient Name: Rachel [name] DOB: [birth year] MRN: [id number]  Date of admission: 06/01/2022 Delivery date:06/01/2022  Delivering provider: Prentice Docker D  Date of discharge: 06/04/2022  Admitting diagnosis: History of cesarean delivery [Z98.891] Intrauterine pregnancy: [redacted]w[redacted]d     Secondary diagnosis:  Principal Problem:   History of cesarean delivery Active Problems:   Encounter for supervision of multigravida of advanced maternal age in third trimester   Diet controlled gestational diabetes mellitus (GDM), antepartum   [redacted] weeks gestation of pregnancy  Additional problems: none    Discharge diagnosis: Term Pregnancy Delivered and GDM A1                                              Post partum procedures: none Augmentation: N/A Complications: None  Hospital course: Sceduled C/S   36 y.o. yo G3P0010 at [redacted]w[redacted]d was admitted to the hospital 06/01/2022 for scheduled cesarean section with the following indication:Elective Repeat.Delivery details are as follows:  Membrane Rupture Time/Date: 8:17 AM ,06/01/2022   Delivery Method:C-Section, Low Transverse  Details of operation can be found in separate operative note.  Patient had an uncomplicated postpartum course.  She is ambulating, tolerating a regular diet, passing flatus, and urinating well. Patient is discharged home in stable condition on  06/04/22        Newborn Data: Birth date:06/01/2022  Birth time:8:17 AM  Gender:Female  Living status:Living  Apgars: ,  Weight:3570 g     Magnesium Sulfate received: No BMZ received: No Rhophylac:N/A MMR:No T-DaP:Given prenatally Flu: N/A Transfusion:No  Physical exam  Vitals:   06/03/22 0818 06/03/22 1502 06/03/22 2300 06/04/22 0903  BP: (!) 126/90 123/84 125/81 126/88  Pulse: 73 78 83 75  Resp: $Remo'20 20 19 18  'DfaWk$ Temp: 98.5 F (36.9 C) 98.1 F (36.7 C) 98 F (36.7 C) 97.9 F (36.6 C)  TempSrc: Oral Oral Oral Oral  SpO2: 100% 100% 98% 100%  Weight:       Height:       General: alert, cooperative, and no distress Lochia: appropriate Uterine Fundus: firm Incision: Healing well with no significant drainage, No significant erythema, Dressing is clean, dry, and intact, OnQ pump dressing clean/dry/intact DVT Evaluation: No evidence of DVT seen on physical exam. Labs: Lab Results  Component Value Date   WBC 9.9 06/02/2022   HGB 11.6 (L) 06/02/2022   HCT 34.8 (L) 06/02/2022   MCV 81.5 06/02/2022   PLT 146 (L) 06/02/2022      Latest Ref Rng & Units 08/19/2021   10:02 AM  CMP  Glucose 70 - 99 mg/dL 84   BUN 6 - 20 mg/dL 13   Creatinine 0.57 - 1.00 mg/dL 0.65   Sodium 134 - 144 mmol/L 140   Potassium 3.5 - 5.2 mmol/L 4.3   Chloride 96 - 106 mmol/L 103   CO2 20 - 29 mmol/L 23   Calcium 8.7 - 10.2 mg/dL 9.8   Total Protein 6.0 - 8.5 g/dL 6.9   Total Bilirubin 0.0 - 1.2 mg/dL 0.3   Alkaline Phos 44 - 121 IU/L 71   AST 0 - 40 IU/L 17   ALT 0 - 32 IU/L 16    Edinburgh Score:    06/04/2022    3:11 AM  Edinburgh Postnatal Depression Scale Screening Tool  I have been able to laugh and see the funny side  of things. 2  I have looked forward with enjoyment to things. 1  I have blamed myself unnecessarily when things went wrong. 1  I have been anxious or worried for no good reason. 2  I have felt scared or panicky for no good reason. 1  Things have been getting on top of me. 2  I have been so unhappy that I have had difficulty sleeping. 2  I have felt sad or miserable. 2  I have been so unhappy that I have been crying. 2  The thought of harming myself has occurred to me. 0  Edinburgh Postnatal Depression Scale Total 15      After visit meds:  Allergies as of 06/04/2022       Reactions   Amoxicillin Shortness Of Breath, Rash   Penicillins Shortness Of Breath, Rash   Did it involve swelling of the face/tongue/throat, SOB, or low BP? Yes Did it involve sudden or severe rash/hives, skin peeling, or any reaction on the inside of your  mouth or nose? Yes Did you need to seek medical attention at a hospital or doctor's office? Yes When did it last happen?   13 years ago    If all above answers are "NO", may proceed with cephalosporin use.        Medication List     STOP taking these medications    pantoprazole 20 MG tablet Commonly known as: PROTONIX   pantoprazole 40 MG tablet Commonly known as: PROTONIX       TAKE these medications    aspirin 81 MG chewable tablet Chew 81 mg by mouth daily.   ferrous sulfate 325 (65 FE) MG tablet Take 1 tablet (325 mg total) by mouth 2 (two) times daily with a meal.   gabapentin 100 MG capsule Commonly known as: Neurontin Take 1 capsule (100 mg total) by mouth 3 (three) times daily for 5 days.   hydrOXYzine 50 MG tablet Commonly known as: ATARAX Take 1 tablet (50 mg total) by mouth at bedtime as needed for anxiety (or sleep). What changed:  medication strength how much to take when to take this reasons to take this   ibuprofen 600 MG tablet Commonly known as: ADVIL Take 1 tablet (600 mg total) by mouth every 6 (six) hours.   oxyCODONE-acetaminophen 5-325 MG tablet Commonly known as: PERCOCET/ROXICET Take 1 tablet by mouth every 4 (four) hours as needed for up to 5 days for moderate pain (pain score 4-7/10).   prenatal multivitamin Tabs tablet Take 1 tablet by mouth daily at 12 noon.   senna-docusate 8.6-50 MG tablet Commonly known as: Senokot-S Take 2 tablets by mouth daily.   sertraline 100 MG tablet Commonly known as: ZOLOFT Take 1 tablet (100 mg total) by mouth daily.   sertraline 50 MG tablet Commonly known as: ZOLOFT Take 1 tablet (50 mg total) by mouth daily. Take alongside 100 mg tab to make 150 mg dose         Discharge home in stable condition Infant Feeding: Breast Infant Disposition:NICU Discharge instruction: per After Visit Summary and Postpartum booklet. Activity: Advance as tolerated. Pelvic rest for 6 weeks.  Diet: routine  diet Anticipated Birth Control:  husband getting vasectomy Postpartum Appointment:6 weeks Additional Postpartum F/U: Incision check 1 week Future Appointments:No future appointments. Follow up Visit:  Follow-up Information     Will Bonnet, MD. Go in 1 week(s).   Specialty: Obstetrics and Gynecology Why: For incision check Contact information: [place]  [place] [address] [phone]         Will Bonnet, MD Follow up in 6 week(s).   Specialty: Obstetrics and Gynecology Why: 6wk postpartum Contact information: [address] [place] [address] [phone]                 SIGNED: Gertie Fey, CNM 06/04/2022 12:01 PM

## 2022-06-01 NOTE — Anesthesia Preprocedure Evaluation (Addendum)
Anesthesia Evaluation  Patient identified by MRN, date of birth, ID band Patient awake    Reviewed: Allergy & Precautions, NPO status , Patient's Chart, lab work & pertinent test results  History of Anesthesia Complications Negative for: history of anesthetic complications  Airway Mallampati: III   Neck ROM: Full    Dental no notable dental hx.    Pulmonary former smoker (quit 2019),    Pulmonary exam normal breath sounds clear to auscultation       Cardiovascular hypertension, Normal cardiovascular exam Rhythm:Regular Rate:Normal     Neuro/Psych Seizures -,  PSYCHIATRIC DISORDERS Anxiety Depression Hx alcohol use disorder, last use 8 years ago    GI/Hepatic GERD  ,  Endo/Other  diabetes, Gestational  Renal/GU negative Renal ROS     Musculoskeletal   Abdominal   Peds  Hematology  (+) Blood dyscrasia, anemia ,   Anesthesia Other Findings 36 yo G3P1011 at 30 0/7 presenting for repeat c-section.  Reproductive/Obstetrics                            Anesthesia Physical Anesthesia Plan  ASA: 2  Anesthesia Plan: Spinal   Post-op Pain Management:    Induction:   PONV Risk Score and Plan: 2 and Ondansetron and Treatment may vary due to age or medical condition  Airway Management Planned: Natural Airway and Nasal Cannula  Additional Equipment:   Intra-op Plan:   Post-operative Plan:   Informed Consent: I have reviewed the patients History and Physical, chart, labs and discussed the procedure including the risks, benefits and alternatives for the proposed anesthesia with the patient or authorized representative who has indicated his/her understanding and acceptance.     Dental Advisory Given  Plan Discussed with: Anesthesiologist, CRNA and Surgeon  Anesthesia Plan Comments: (Patient reports no bleeding problems and no anticoagulant use.  Plan for spinal with backup GA.  Patient  consented for risks of anesthesia including but not limited to:  - adverse reactions to medications - damage to eyes, teeth, lips or other oral mucosa - nerve damage due to positioning  - risk of bleeding, infection and or nerve damage from spinal that could lead to paralysis - risk of headache or failed spinal - damage to teeth, lips or other oral mucosa - sore throat or hoarseness - damage to heart, brain, nerves, lungs, other parts of body or loss of life  Patient voiced understanding.)        Anesthesia Quick Evaluation

## 2022-06-01 NOTE — Interval H&P Note (Signed)
History and Physical Interval Note:  06/01/2022 7:35 AM  Rachel [name]  has presented today for surgery at [redacted]w[redacted]d, with the diagnosis of prior cesarean.  The various methods of treatment have been discussed with the patient and family. After consideration of risks, benefits and other options for treatment, the patient has consented to  Procedure(s): REPEAT CESAREAN SECTION (N/A) as a surgical intervention.  The patient's history has been reviewed, patient examined, no change in status, stable for surgery.  I have reviewed the patient's chart and labs.  Questions were answered to the patient's satisfaction.  She notes her BG levels have been normal. SHe notes +FM, no LOF, no vaginal bleeding, and mild ctx.  Consents reviewed and she is ready to proceed.  Thomasene Mohair, MD, [facility] 06/01/2022 7:35 AM

## 2022-06-01 NOTE — Transfer of Care (Signed)
Immediate Anesthesia Transfer of Care Note  Patient: Rachel [name]  Procedure(s) Performed: REPEAT CESAREAN SECTION  Patient Location: LDR6  Anesthesia Type:Spinal  Level of Consciousness: awake, alert  and oriented  Airway & Oxygen Therapy: Patient Spontanous Breathing  Post-op Assessment: Report given to RN and Post -op Vital signs reviewed and stable  Post vital signs: Reviewed and stable  Last Vitals:  Vitals Value Taken Time  BP 111/64   Temp    Pulse 90   Resp 14   SpO2 98     Last Pain:  Vitals:   06/01/22 0705  TempSrc: Oral  PainSc: 0-No pain         Complications: No notable events documented.

## 2022-06-02 ENCOUNTER — Encounter: Payer: Self-pay | Admitting: Obstetrics and Gynecology

## 2022-06-02 LAB — CBC
HCT: 34.8 % — ABNORMAL LOW (ref 36.0–46.0)
Hemoglobin: 11.6 g/dL — ABNORMAL LOW (ref 12.0–15.0)
MCH: 27.2 pg (ref 26.0–34.0)
MCHC: 33.3 g/dL (ref 30.0–36.0)
MCV: 81.5 fL (ref 80.0–100.0)
Platelets: 146 10*3/uL — ABNORMAL LOW (ref 150–400)
RBC: 4.27 MIL/uL (ref 3.87–5.11)
RDW: 12.7 % (ref 11.5–15.5)
WBC: 9.9 10*3/uL (ref 4.0–10.5)
nRBC: 0 % (ref 0.0–0.2)

## 2022-06-02 LAB — GLUCOSE, CAPILLARY
Glucose-Capillary: 105 mg/dL — ABNORMAL HIGH (ref 70–99)
Glucose-Capillary: 138 mg/dL — ABNORMAL HIGH (ref 70–99)
Glucose-Capillary: 106 mg/dL — ABNORMAL HIGH (ref 70–99)

## 2022-06-02 MED ORDER — HYDROXYZINE HCL 25 MG PO TABS
50.0000 mg | ORAL_TABLET | Freq: Once | ORAL | Status: AC
  Administered 2022-06-02: 50 mg via ORAL
  Filled 2022-06-02: qty 2

## 2022-06-02 NOTE — Progress Notes (Signed)
Post Partum Day 1  Subjective: Doing well, no concerns. Ambulating without difficulty, pain managed with PO meds, tolerating regular diet, and voiding without difficulty.   No fever/chills, chest pain, shortness of breath, nausea/vomiting, or leg pain. No nipple or breast pain. No headache, visual changes, or RUQ/epigastric pain.  Objective: BP 114/78 (BP Location: Left Arm)   Pulse 68   Temp 98.4 F (36.9 C) (Oral)   Resp 18   Ht 5' (1.524 m)   Wt 77.1 kg   LMP 09/01/2021 (Exact Date)   SpO2 97%   BMI 33.20 kg/m    Physical Exam:  General: alert and cooperative Breasts: soft/nontender CV: RRR Pulm: nl effort Abdomen: soft, non-tender Uterine Fundus: firm Incision: healing well Perineum: intact Lochia: appropriate DVT Evaluation: No evidence of DVT seen on physical exam. Edinburgh:     01/18/2019    9:00 AM  Inocente Salles Postnatal Depression Scale Screening Tool  I have been able to laugh and see the funny side of things. 0  I have looked forward with enjoyment to things. 0  I have blamed myself unnecessarily when things went wrong. 1  I have been anxious or worried for no good reason. 1  I have felt scared or panicky for no good reason. 0  Things have been getting on top of me. 1  I have been so unhappy that I have had difficulty sleeping. 1  I have felt sad or miserable. 1  I have been so unhappy that I have been crying. 0  The thought of harming myself has occurred to me. 0  Edinburgh Postnatal Depression Scale Total 5     Recent Labs    05/30/22 1254 06/02/22 1133  HGB 12.3 11.6*  HCT 37.0 34.8*  WBC 7.2 9.9  PLT 200 146*    Assessment/Plan: 36 y.o. G3P1011 postpartum day # 1  -Continue routine postpartum care -Lactation consult PRN for breastfeeding   -Immunization status: all immunizations up to date  Disposition: Continue inpatient postpartum care    LOS: 1 day   Adriano Bischof, CNM 06/02/2022, 12:00 PM

## 2022-06-02 NOTE — Anesthesia Postprocedure Evaluation (Signed)
Anesthesia Post Note  Patient: Rachel [name]  Procedure(s) Performed: REPEAT CESAREAN SECTION  Patient location during evaluation: Mother Baby Anesthesia Type: Spinal Level of consciousness: awake Respiratory status: spontaneous breathing Postop Assessment: no headache Anesthetic complications: no   No notable events documented.   Last Vitals:  Vitals:   06/01/22 2310 06/02/22 0110  BP: 115/75   Pulse: 79   Resp: 18   Temp: 36.8 C   SpO2: 97% 93%    Last Pain:  Vitals:   06/01/22 2350  TempSrc:   PainSc: 0-No pain                 Jaye Beagle

## 2022-06-03 LAB — GLUCOSE, CAPILLARY: Glucose-Capillary: 91 mg/dL (ref 70–99)

## 2022-06-03 MED ORDER — HYDROXYZINE HCL 25 MG PO TABS
50.0000 mg | ORAL_TABLET | Freq: Every evening | ORAL | Status: DC | PRN
  Administered 2022-06-03: 50 mg via ORAL
  Filled 2022-06-03: qty 2

## 2022-06-03 NOTE — Progress Notes (Signed)
Postop Day  2  Subjective: up ad lib, voiding, and tolerating PO Up to visit infant in SCN.  Still feeling overwhelmed and stressed about diagnosis. Was able to sleep last night, vistaril helped.   Ambulating without difficulty, pain managed with PO meds, tolerating regular diet, and voiding without difficulty.   No fever/chills, chest pain, shortness of breath, nausea/vomiting, or leg pain. No nipple or breast pain. No headache, visual changes, or RUQ/epigastric pain.  Objective: BP (!) 126/90 (BP Location: Left Arm)   Pulse 73   Temp 98.5 F (36.9 C) (Oral)   Resp 20   Ht 5' (1.524 m)   Wt 77.1 kg   LMP 09/01/2021 (Exact Date)   SpO2 100%   BMI 33.20 kg/m    Physical Exam:  General: alert, appears stated age, and no distress Breasts: soft/nontender CV: RRR Pulm: nl effort, CTABL Abdomen: soft, non-tender, active bowel sounds Uterine Fundus: firm Incision: no significant drainage, covered with OP site, On-Q ball in place  Perineum: minimal edema, intact Lochia: appropriate DVT Evaluation: No evidence of DVT seen on physical exam.  Recent Labs    06/02/22 1133  HGB 11.6*  HCT 34.8*  WBC 9.9  PLT 146*    Assessment/Plan: 36 y.o. G3P1011 postpartum day # 2  -Continue routine postpartum care -Lactation consult for pumping - encouraged to express breast 8-12 times in 24 hours.   -Discussed that sleep is important and can schedule pumping around sleep schedule  -Infant remains in SCN for monitoring and care  -Acute blood loss anemia - hemodynamically stable and asymptomatic; start PO ferrous sulfate BID with stool softeners  -Immunization status:   all immunizations up to date   Disposition: Continue inpatient postpartum care    LOS: 2 days   Rachel [name], CNM 06/03/2022, 8:33 AM   ----- Rachel [name]  Certified Nurse Midwife [facility]

## 2022-06-03 NOTE — Lactation Note (Signed)
This note was copied from a baby's chart. Lactation Consultation Note  Patient Name: Rachel [name] HTVGV'S Date: 06/03/2022 Reason for consult: Initial assessment;Term;Other (Comment) (Baby in [facility]) Age:36 hours  Maternal Data This is mom's 2nd baby, repeat C/S. Baby to [facility] for respiratory distress post C/S. Mom is an experienced breastfeeding mother, breastfed first child for 2 years 53 months.Mom with history of gestational diabetes, reports today she kept a strict diet during the pregnancy with a focus on eating healthy. Mom with history of anxiety, depression, anemia, and pregnancy induced hypertension. Today met with mom while she was pumping. Mom reports yesterday she was not obtaining any colostrum, however today she is producing measurable amounts and is pleased . Per mom she feels this is one thing she can do and yesterday was stressful and she could not express milk. Support and encouragement provided. Mom's goal is to breastfeed once the baby is able to.  Has patient been taught Hand Expression?: Yes Does the patient have breastfeeding experience prior to this delivery?: Yes How long did the patient breastfeed?: 2 years 10 months  Feeding Mother's Current Feeding Choice: Breast Milk and Formula   Lactation Tools Discussed/Used Tools: Pump Breast pump type: Double-Electric Breast Pump Pump Education: Setup, frequency, and cleaning;Milk Storage Reason for Pumping: Baby in SCN Pumping frequency: 8 times/24 hours (Mom reports she has pumped 4 times since 6 am this morning.) Pumped volume:  (Mom reports she was not expressing colostrum yesterday despite pumping but as of today she is expressing measurable amounts of colostrum.)  Interventions Interventions: Hand express;DEBP;Education (Recommended mom do skin to skin care with her baby. Post pumping after skin to skin can increase milk yields.)  Discharge Pump: Personal (Mom has an electric pump for home use.)  Consult  Status Consult Status: PRN  Update provided to care nurse.  Rachel [name] 06/03/2022, 2:47 PM

## 2022-06-04 MED ORDER — GABAPENTIN 100 MG PO CAPS: 100.0000 mg | ORAL_CAPSULE | Freq: Three times a day (TID) | ORAL | 0 refills | Status: DC

## 2022-06-04 MED ORDER — OXYCODONE-ACETAMINOPHEN 5-325 MG PO TABS: 1.0000 | ORAL_TABLET | ORAL | 0 refills | Status: AC | PRN

## 2022-06-04 MED ORDER — FERROUS SULFATE 325 (65 FE) MG PO TABS: 325.0000 mg | ORAL_TABLET | Freq: Two times a day (BID) | ORAL | 3 refills | Status: DC

## 2022-06-04 MED ORDER — IBUPROFEN 600 MG PO TABS: 600.0000 mg | ORAL_TABLET | Freq: Four times a day (QID) | ORAL | 0 refills | Status: DC

## 2022-06-04 MED ORDER — PRENATAL MULTIVITAMIN CH: 1.0000 | ORAL_TABLET | Freq: Every day | ORAL | Status: DC

## 2022-06-04 MED ORDER — SENNOSIDES-DOCUSATE SODIUM 8.6-50 MG PO TABS: 2.0000 | ORAL_TABLET | ORAL | Status: DC

## 2022-06-04 MED ORDER — HYDROXYZINE HCL 50 MG PO TABS: 50.0000 mg | ORAL_TABLET | Freq: Every evening | ORAL | 0 refills | Status: DC | PRN

## 2022-06-04 NOTE — Clinical Social Work Maternal (Signed)
CLINICAL SOCIAL WORK MATERNAL/CHILD NOTE  Patient Details  Name: Rachel [name] MRN: [id number] Date of Birth: 1986/08/29  Date:  06/04/2022  Clinical Social Worker Initiating Note:  Kavitha Lansdale Date/Time: Initiated:  06/04/22/      Child's Name:  Rachel [name]   Biological Parents:  Mother, Father   Need for Interpreter:  None   Reason for Referral:  Other (Comment) (Edinburgh 15)   Address:  [place] [address]    Phone number:  [phone] (home)     Additional phone number:   Household Members/Support Persons (HM/SP):   Household Member/Support Person 1, Household Member/Support Person 2   HM/SP Name Relationship DOB or Age  HM/SP -[place] spouse    HM/SP -2 William [place] son    HM/SP -3        HM/SP -4        HM/SP -5        HM/SP -6        HM/SP -7        HM/SP -8          Natural Supports (not living in the home):  Extended Family, Friends, Immediate Family   Professional Supports:     Employment:     Type of Work:     Education:      Homebound arranged:    Pensions consultant:      Other Resources:      Cultural/Religious Considerations Which May Impact Care:    Strengths:  Ability to meet basic needs  , Compliance with medical plan  , Pediatrician chosen, Home prepared for child  , Understanding of illness   Psychotropic Medications:         Pediatrician:    [facility]  Pediatrician List:   [place]      Pediatrician Fax Number:    Risk Factors/Current Problems:  None   Cognitive State:  Able to Concentrate  , Alert  , Insightful     Mood/Affect:  Calm     CSW Assessment: CSW received a consult for Edinburgh Score of 15. Baby is also in the Special Care Nursery.  CSW spoke with RN prior to meeting with MOB. Per RN, no additional needs/concerns.   CSW met with MOB  at bedside. MOB elected for FOB to remain at bedside during assessment. Explained CSW's role and reason for referral.  MOB reported she is feeling "fine" post delivery. MOB and FOB discussed it being hard for Baby to be in SCN. Provided reassurance and encouraged them to reach out with any resource needs.   MOB was alert/appropriate during assessment.   Confirmed contact information for MOB.   MOB denied resource needs at this time. MOB plans to use [employer] for [facility]. MOB reported she has all items needed for Baby. MOB reported she has reliable transportation for herself and Baby.   MOB reported she has a history of anxiety and depression. MOB stated she has seen a therapist in the past, is not currently seeing one, but knows how to get one if needed. MOB reported she has taken medications to help manage her depression her whole life. MOB reported she has a good support system and is coping well emotionally at this time. MOB denied SI, HI, or DV. MOB denied the need for mental  health support resources at this time, reported she is aware of resources if needed.  CSW provided education and information sheets on PPD and SIDS. MOB verbalized understanding. CSW ecouraged MOB to reach out to her Provider with any questions or needs for support or resources, even after discharge.   MOB denied any needs or questions at this time. CSW encouraged MOB to reach out if any arise prior to discharge or prior to Baby being discharged from [facility].   Please re consult CSW if any additional needs or concerns arise.  CSW Plan/Description:  Sudden Infant Death Syndrome (SIDS) Education, Perinatal Mood and Anxiety Disorder (PMADs) Education, No Further Intervention Required/No Barriers to Discharge, Psychosocial Support and Ongoing Assessment of Needs    [place], LCSW 06/04/2022, 10:18 AM

## 2022-06-04 NOTE — Progress Notes (Signed)
Discharge instructions reviewed with patient. F/u apts, surgical site care, On-Q pump care, breastmilk storage and pumping, PPD s/s and PP care reviewed. Questions answers and verbalizes understanding. Infant in SCN, pt escorted to personal vehicle via wheelchair.

## 2022-06-09 ENCOUNTER — Other Ambulatory Visit: Payer: Self-pay | Admitting: Nurse Practitioner

## 2022-06-10 ENCOUNTER — Ambulatory Visit: Payer: Self-pay

## 2022-06-10 NOTE — Lactation Note (Signed)
This note was copied from a baby's chart. Lactation Consultation Note  Patient Name: Girl Cornisha Zetino OTRRN'H Date: 06/10/2022 Reason for consult: Follow-up assessment;NICU baby Age:36 days  Maternal Data    Feeding Mother's Current Feeding Choice: Breast Milk BAby on mom's chest sleeping when I spoke with mom, baby continues to tire easily at breast with short intervals of nursing, mom states she pumps approx 6 oz q 4-6 h at home, able to pump more volume when visiting baby in SCN   LATCH Score Latch:  (I did not observe a feeding)                  Lactation Tools Discussed/Used Tools: Pump Breast pump type: Double-Electric Breast Pump;Other (comment) (home pump) Reason for Pumping: baby in SCN Pumping frequency: q 4-6 Pumped volume: 180 mL  Interventions  Encouragement and praise given mom.   Discharge Pump: DEBP;Personal  Consult Status Consult Status: PRN    Dyann Kief 06/10/2022, 2:45 PM

## 2022-06-10 NOTE — Telephone Encounter (Signed)
Requested medication (s) are due for refill today: no  Requested medication (s) are on the active medication list:yes  Last refill:  06/04/22, discontinued  Future visit scheduled: no  Notes to clinic:  Unable to refill per protocol, Rx expired. Medication was discontinued 06/04/22, still on active list, routing for review.     Requested Prescriptions  Pending Prescriptions Disp Refills   hydrOXYzine (ATARAX) 25 MG tablet [Pharmacy Med Name: Hydroxyzine Hydrochloride 25mg  Tablet] 30 tablet 0    Sig: Take 1/2 to 1 tablet by mouth three times daily as needed.     Ear, Nose, and Throat:  Antihistamines 2 Passed - 06/09/2022  8:25 AM      Passed - Cr in normal range and within 360 days    Creatinine  Date Value Ref Range Status  08/01/2013 0.78 0.60 - 1.30 mg/dL Final   Creatinine, Ser  Date Value Ref Range Status  08/19/2021 0.65 0.57 - 1.00 mg/dL Final   Creatinine, Urine  Date Value Ref Range Status  01/16/2019 72.77 mg/dL Final         Passed - Valid encounter within last 12 months    Recent Outpatient Visits           9 months ago Weight gain   [facility], NP   1 year ago Moderate episode of recurrent major depressive disorder (HCC)   [facility], NP   1 year ago Moderate episode of recurrent major depressive disorder (HCC)   [facility] Larae Grooms, NP   2 years ago Anxiety   [facility] [place], [place]   2 years ago Anxiety   [facility] [place], [place]

## 2022-06-16 ENCOUNTER — Ambulatory Visit: Payer: Self-pay

## 2022-06-16 NOTE — Lactation Note (Signed)
This note was copied from a baby's chart. Lactation Consultation Note  Patient Name: Rachel [name] BSWHQ'P Date: 06/16/2022   Age:36 wk.o.  Mom had questions re: pumps and hands-free pumps to utilize while traveling back and forth visiting. Parents voice a potential for baby being transferred to [facility] and wanted to be prepared. Mom is currently using Medela free-style flex, which can be portable, but would like a more less-obvious option while in the car.  We discussed the 2 better known ones: Willow, Mom Cozy. Discussed fitting of flanges, ensuring adequate emptying, and use of bedside hospital grade pump while with baby. Also encouraged mom to continue using a plug-in option pump when at home, limiting the hands free to occasional/travel.  Mom had no additional questions/concerns at this time.  Danford Bad 06/16/2022, 11:10 AM

## 2022-07-19 ENCOUNTER — Encounter: Payer: Self-pay | Admitting: Nurse Practitioner

## 2022-09-10 ENCOUNTER — Other Ambulatory Visit: Payer: Self-pay | Admitting: Nurse Practitioner

## 2022-09-11 ENCOUNTER — Other Ambulatory Visit: Payer: Self-pay | Admitting: Nurse Practitioner

## 2022-09-12 NOTE — Telephone Encounter (Signed)
Requested medication (s) are due for refill today: yes  Requested medication (s) are on the active medication list: yes  Last refill:  01/14/22 #90 1 RF  Future visit scheduled: yes  Notes to clinic:  overdue lab work-    Requested Prescriptions  Pending Prescriptions Disp Refills   sertraline (ZOLOFT) 50 MG tablet [Pharmacy Med Name: Sertraline Hydrochloride 50mg  Tablet] 90 tablet 0    Sig: Take 1 tablet by mouth daily. Take with the 100 mg tablet to make a 150 mg dose.     Psychiatry:  Antidepressants - SSRI - sertraline Failed - 09/10/2022  3:14 PM      Failed - AST in normal range and within 360 days    AST  Date Value Ref Range Status  08/19/2021 17 0 - 40 IU/L Final   SGOT(AST)  Date Value Ref Range Status  08/01/2013 28 15 - 37 Unit/L Final         Failed - ALT in normal range and within 360 days    ALT  Date Value Ref Range Status  08/19/2021 16 0 - 32 IU/L Final   SGPT (ALT)  Date Value Ref Range Status  08/01/2013 23 12 - 78 U/L Final         Failed - Completed PHQ-2 or PHQ-9 in the last 360 days      Failed - Valid encounter within last 6 months    Recent Outpatient Visits           1 year ago Weight gain   [facility], NP   1 year ago Moderate episode of recurrent major depressive disorder (HCC)   [facility], NP   2 years ago Moderate episode of recurrent major depressive disorder (HCC)   [facility] Larae Grooms, NP   2 years ago Anxiety   [facility] [place], [place]   2 years ago Anxiety   [facility] [place], [place], [place]       Future Appointments             In 2 weeks [place], NP [facility], [facility]

## 2022-09-13 NOTE — Telephone Encounter (Signed)
Requested medications are due for refill today.  yes  Requested medications are on the active medications list.  yes  Last refill. 01/14/2022 #90 1 rf  Future visit scheduled.   yes  Notes to clinic.  Labs are expired    Requested Prescriptions  Pending Prescriptions Disp Refills   sertraline (ZOLOFT) 100 MG tablet [Pharmacy Med Name: Sertraline Hydrochloride 100mg  Tablet] 90 tablet 0    Sig: Take 1 tablet by mouth daily. **Please call and schedule an appointment.**     Psychiatry:  Antidepressants - SSRI - sertraline Failed - 09/11/2022 12:09 PM      Failed - AST in normal range and within 360 days    AST  Date Value Ref Range Status  08/19/2021 17 0 - 40 IU/L Final   SGOT(AST)  Date Value Ref Range Status  08/01/2013 28 15 - 37 Unit/L Final         Failed - ALT in normal range and within 360 days    ALT  Date Value Ref Range Status  08/19/2021 16 0 - 32 IU/L Final   SGPT (ALT)  Date Value Ref Range Status  08/01/2013 23 12 - 78 U/L Final         Failed - Completed PHQ-2 or PHQ-9 in the last 360 days      Failed - Valid encounter within last 6 months    Recent Outpatient Visits           1 year ago Weight gain   [facility], NP   1 year ago Moderate episode of recurrent major depressive disorder (HCC)   [facility], NP   2 years ago Moderate episode of recurrent major depressive disorder (HCC)   [facility] Larae Grooms, NP   2 years ago Anxiety   [facility] [place], [place]   2 years ago Anxiety   [facility] [place], [place], [place]       Future Appointments             In 2 weeks [place], NP [facility], [facility]

## 2022-09-27 ENCOUNTER — Other Ambulatory Visit: Payer: Self-pay | Admitting: Nurse Practitioner

## 2022-09-27 ENCOUNTER — Ambulatory Visit (INDEPENDENT_AMBULATORY_CARE_PROVIDER_SITE_OTHER): Payer: 59 | Admitting: Nurse Practitioner

## 2022-09-27 ENCOUNTER — Encounter: Payer: Self-pay | Admitting: Nurse Practitioner

## 2022-09-27 VITALS — BP 102/66 | HR 72 | Temp 97.9°F | Wt 158.9 lb

## 2022-09-27 DIAGNOSIS — F419 Anxiety disorder, unspecified: Secondary | ICD-10-CM | POA: Diagnosis not present

## 2022-09-27 DIAGNOSIS — F331 Major depressive disorder, recurrent, moderate: Secondary | ICD-10-CM

## 2022-09-27 MED ORDER — SERTRALINE HCL 100 MG PO TABS: 100.0000 mg | ORAL_TABLET | Freq: Every day | ORAL | 1 refills | Status: DC

## 2022-09-27 MED ORDER — SERTRALINE HCL 50 MG PO TABS: ORAL_TABLET | ORAL | 1 refills | Status: DC

## 2022-09-27 MED ORDER — HYDROXYZINE HCL 50 MG PO TABS
50.0000 mg | ORAL_TABLET | Freq: Every evening | ORAL | 2 refills | Status: DC | PRN
Start: 2022-09-27 — End: 2022-09-30

## 2022-09-27 NOTE — Assessment & Plan Note (Signed)
Chronic.  Controlled.  Continue with current medication regimen of Zoloft 150mg daily and Hydroxyzine 50mg PRN.  Refills sent today.  Return to clinic in 6 months for reevaluation.  Call sooner if concerns arise.   

## 2022-09-27 NOTE — Assessment & Plan Note (Signed)
Chronic.  Controlled.  Continue with current medication regimen of Zoloft 150mg  daily and Hydroxyzine 50mg  PRN.  Refills sent today.  Return to clinic in 6 months for reevaluation.  Call sooner if concerns arise.

## 2022-09-27 NOTE — Progress Notes (Signed)
BP 102/66   Pulse 72   Temp 97.9 F (36.6 C) (Oral)   Wt 158 lb 14.4 oz (72.1 kg)   LMP  (LMP Unknown)   SpO2 97%   BMI 31.03 kg/m    Subjective:    Patient ID: Rachel [name], female    DOB: [birth year], 36 y.o.   MRN: [id number]  HPI: Rachel [name] is a 36 y.o. female  Chief Complaint  Patient presents with   Depression   Anxiety    DEPRESSION Patient states she feels like things are going well.  Feels like she is doing well.  She works from home and keeps her son at home.  This causes her stress. Denies SI.      09/27/2022    2:43 PM 08/19/2021    9:39 AM 02/22/2021    3:31 PM 06/25/2020    2:43 PM 06/25/2020    2:33 PM  Depression screen PHQ 2/9  Decreased Interest 0 0 0 0 0  Down, Depressed, Hopeless 0 0 0 0 0  PHQ - 2 Score 0 0 0 0 0  Altered sleeping 0 1 0 3 1  Tired, decreased energy 0 0 1 1 0  Change in appetite 2 0 0 0 0  Feeling bad or failure about yourself  0 0 0 0 0  Trouble concentrating 0 0 1 1 1   Moving slowly or fidgety/restless 0 0 0 0 0  Suicidal thoughts 0 0 0 0 0  PHQ-9 Score 2 1 2 5 2   Difficult doing work/chores Somewhat difficult Not difficult at all  Somewhat difficult Not difficult at all      09/27/2022    2:44 PM 08/19/2021    9:40 AM 02/22/2021    3:32 PM 06/25/2020    2:35 PM  GAD 7 : Generalized Anxiety Score  Nervous, Anxious, on Edge 1 0 1 1  Control/stop worrying 1 0 3 0  Worry too much - different things 1 0 2 0  Trouble relaxing 1 0 3 2  Restless 1 0 0 0  Easily annoyed or irritable 0 1 3 1   Afraid - awful might happen 0 0 0 1  Total GAD 7 Score 5 1 12 5   Anxiety Difficulty Somewhat difficult Not difficult at all  Somewhat difficult      Relevant past medical, surgical, family and social history reviewed and updated as indicated. Interim medical history since our last visit reviewed. Allergies and medications reviewed and updated.  Review of Systems  Gastrointestinal:  Negative for nausea.   Psychiatric/Behavioral:  Positive for dysphoric mood. Negative for suicidal ideas. The patient is nervous/anxious.     Per HPI unless specifically indicated above     Objective:    BP 102/66   Pulse 72   Temp 97.9 F (36.6 C) (Oral)   Wt 158 lb 14.4 oz (72.1 kg)   LMP  (LMP Unknown)   SpO2 97%   BMI 31.03 kg/m   Wt Readings from Last 3 Encounters:  09/27/22 158 lb 14.4 oz (72.1 kg)  06/01/22 170 lb (77.1 kg)  05/24/22 170 lb (77.1 kg)    Physical Exam Vitals and nursing note reviewed.  Constitutional:      General: She is not in acute distress.    Appearance: Normal appearance. She is normal weight. She is not ill-appearing, toxic-appearing or diaphoretic.  HENT:     Head: Normocephalic.     Right Ear: External ear normal.  Left Ear: External ear normal.     Nose: Nose normal.     Mouth/Throat:     Mouth: Mucous membranes are moist.     Pharynx: Oropharynx is clear.  Eyes:     General:        Right eye: No discharge.        Left eye: No discharge.     Extraocular Movements: Extraocular movements intact.     Conjunctiva/sclera: Conjunctivae normal.     Pupils: Pupils are equal, round, and reactive to light.  Cardiovascular:     Rate and Rhythm: Normal rate and regular rhythm.     Heart sounds: No murmur heard. Pulmonary:     Effort: Pulmonary effort is normal. No respiratory distress.     Breath sounds: Normal breath sounds. No wheezing or rales.  Musculoskeletal:     Cervical back: Normal range of motion and neck supple.  Skin:    General: Skin is warm and dry.     Capillary Refill: Capillary refill takes less than 2 seconds.  Neurological:     General: No focal deficit present.     Mental Status: She is alert and oriented to person, place, and time. Mental status is at baseline.  Psychiatric:        Mood and Affect: Mood normal.        Behavior: Behavior normal.        Thought Content: Thought content normal.        Judgment: Judgment normal.      Results for orders placed or performed in visit on 07/19/22  HM PAP SMEAR  Result Value Ref Range   HM Pap smear see result scanned into chart       Assessment & Plan:   Problem List Items Addressed This Visit       Other   Moderate episode of recurrent major depressive disorder ([place]) - Primary    Chronic.  Controlled.  Continue with current medication regimen of Zoloft 150mg  daily and Hydroxyzine 50mg  PRN.  Refills sent today.  Return to clinic in 6 months for reevaluation.  Call sooner if concerns arise.       Relevant Medications   sertraline (ZOLOFT) 100 MG tablet   sertraline (ZOLOFT) 50 MG tablet   hydrOXYzine (ATARAX) 50 MG tablet   Anxiety    Chronic.  Controlled.  Continue with current medication regimen of Zoloft 150mg  daily and Hydroxyzine 50mg  PRN.  Refills sent today.  Return to clinic in 6 months for reevaluation.  Call sooner if concerns arise.        Relevant Medications   sertraline (ZOLOFT) 100 MG tablet   sertraline (ZOLOFT) 50 MG tablet   hydrOXYzine (ATARAX) 50 MG tablet     Follow up plan: Return in about 6 months (around 03/29/2023) for Physical and Fasting labs.

## 2022-09-29 NOTE — Telephone Encounter (Signed)
Requested medication (s) are due for refill today: No  Requested medication (s) are on the active medication list: Yes  Last refill:  09/27/22  Future visit scheduled: Yes  Notes to clinic:  See pharmacy request.    Requested Prescriptions  Pending Prescriptions Disp Refills   hydrOXYzine (ATARAX) 50 MG tablet [Pharmacy Med Name: Hydroxyzine Hydrochloride 50mg  Tablet] 30 tablet 2    Sig: Take 1 tablet by mouth at bedtime as needed for anxiety or sleep.     Ear, Nose, and Throat:  Antihistamines 2 Failed - 09/27/2022  3:15 PM      Failed - Cr in normal range and within 360 days    Creatinine  Date Value Ref Range Status  08/01/2013 0.78 0.60 - 1.30 mg/dL Final   Creatinine, Ser  Date Value Ref Range Status  08/19/2021 0.65 0.57 - 1.00 mg/dL Final   Creatinine, Urine  Date Value Ref Range Status  01/16/2019 72.77 mg/dL Final         Passed - Valid encounter within last 12 months    Recent Outpatient Visits           2 days ago Moderate episode of recurrent major depressive disorder (HCC)   [facility]   1 year ago Weight gain   [facility]   1 year ago Moderate episode of recurrent major depressive disorder (HCC)   [facility], NP   2 years ago Moderate episode of recurrent major depressive disorder (HCC)   [facility] Larae Grooms, NP   2 years ago Anxiety   [facility] [place], [place], [place]ws       Future Appointments             In 6 months [place], NP [facility]

## 2022-11-23 ENCOUNTER — Other Ambulatory Visit: Payer: Self-pay | Admitting: Nurse Practitioner

## 2023-01-14 ENCOUNTER — Other Ambulatory Visit: Payer: Self-pay | Admitting: Nurse Practitioner

## 2023-01-16 NOTE — Telephone Encounter (Signed)
Requested medication (s) are due for refill today: yes  Requested medication (s) are on the active medication list: yes  Last refill:  11/24/22 #30/1  Future visit scheduled: yes  Notes to clinic:  Unable to refill per protocol due to failed labs, no updated results.    Requested Prescriptions  Pending Prescriptions Disp Refills   hydrOXYzine (ATARAX) 50 MG tablet [Pharmacy Med Name: Hydroxyzine Hydrochloride 50mg  Tablet] 30 tablet 0    Sig: Take 1 tablet by mouth at bedtime as needed for anxiety or sleep.     Ear, Nose, and Throat:  Antihistamines 2 Failed - 01/14/2023  8:10 PM      Failed - Cr in normal range and within 360 days    Creatinine  Date Value Ref Range Status  08/01/2013 0.78 0.60 - 1.30 mg/dL Final   Creatinine, Ser  Date Value Ref Range Status  08/19/2021 0.65 0.57 - 1.00 mg/dL Final   Creatinine, Urine  Date Value Ref Range Status  01/16/2019 72.77 mg/dL Final         Passed - Valid encounter within last 12 months    Recent Outpatient Visits           3 months ago Moderate episode of recurrent major depressive disorder [facility])   [place] [facility] Lar[facility], [facility]   1 year ago Weight gain   [place] [facility], [facility]   1 year ago Moderate episode of recurrent major depressive disorder [facility])   [place] Lar[facility] Grooms, NP   2 years ago Moderate episode of recurrent major depressive disorder [facility])   [place] [facility] Gabriel Cirri, NP   3 years ago Anxiety   [place] [place], [place], [place]       Future Appointments             In 2 months Larae Grooms, NP [facility], [facility]

## 2023-03-06 ENCOUNTER — Other Ambulatory Visit: Payer: Self-pay | Admitting: Nurse Practitioner

## 2023-03-07 NOTE — Telephone Encounter (Signed)
Requested Prescriptions  Pending Prescriptions Disp Refills   sertraline (ZOLOFT) 100 MG tablet [Pharmacy Med Name: Sertraline Hydrochloride 100mg  Tablet] 90 tablet 0    Sig: Take 1 tablet by mouth daily.     Psychiatry:  Antidepressants - SSRI - sertraline Failed - 03/06/2023  2:01 PM      Failed - AST in normal range and within 360 days    AST  Date Value Ref Range Status  08/19/2021 17 0 - 40 IU/L Final   SGOT(AST)  Date Value Ref Range Status  08/01/2013 28 15 - 37 Unit/L Final         Failed - ALT in normal range and within 360 days    ALT  Date Value Ref Range Status  08/19/2021 16 0 - 32 IU/L Final   SGPT (ALT)  Date Value Ref Range Status  08/01/2013 23 12 - 78 U/L Final         Passed - Completed PHQ-2 or PHQ-9 in the last 360 days      Passed - Valid encounter within last 6 months    Recent Outpatient Visits           5 months ago Moderate episode of recurrent major depressive disorder [facility])   [place] [facility] Lar[facility] Grooms, NP   1 year ago Weight gain   [place] [facility] Lar[facility] G[facility], NP   2 years ago Moderate episode of recurrent major depressive disorder [facility])   [place] [facility] Lar[facility] Grooms, NP   2 years ago Moderate episode of recurrent major depressive disorder [facility])   [place] [facility] Gabriel Cirri, NP   3 years ago Anxiety   [place] [facility] Particia [place], [place]       Future Appointments             In 3 weeks Larae Grooms, NP [place] [facility], [facility]             hydrOXYzine (ATARAX) 50 MG tablet [Pharmacy Med Name: Hydroxyzine Hydrochloride 50mg  Tablet] 30 tablet 0    Sig: Take 1 tablet by mouth at bedtime as needed for anxiety or sleep.     Ear, Nose, and Throat:  Antihistamines 2 Failed - 03/06/2023  2:01 PM      Failed - Cr in normal range and within 360 days    Creatinine  Date Value Ref Range  Status  08/01/2013 0.78 0.60 - 1.30 mg/dL Final   Creatinine, Ser  Date Value Ref Range Status  08/19/2021 0.65 0.57 - 1.00 mg/dL Final   Creatinine, Urine  Date Value Ref Range Status  01/16/2019 72.77 mg/dL Final         Passed - Valid encounter within last 12 months    Recent Outpatient Visits           5 months ago Moderate episode of recurrent major depressive disorder [facility])   [place] [facility], NP   1 year ago Weight gain   [facility] Lar[facility], NP   2 years ago Moderate episode of recurrent major depressive disorder [facility])   [place] [facility] Lar[facility] Grooms, NP   2 years ago Moderate episode of recurrent major depressive disorder [facility])   [place] [facility] Gabriel Cirri, NP   3 years ago Anxiety   [place] [facility] Particia Nearing, [place]  Future Appointments             In 3 weeks Larae Grooms, NP [facility]

## 2023-03-09 ENCOUNTER — Other Ambulatory Visit: Payer: Self-pay | Admitting: Nurse Practitioner

## 2023-03-10 NOTE — Telephone Encounter (Signed)
Requested medications are due for refill today.  Yes  Requested medications are on the active medications list.  yes  Last refill. 09/27/2022 #90 1rf  Future visit scheduled.   yes  Notes to clinic.  Labs are expired.    Requested Prescriptions  Pending Prescriptions Disp Refills   sertraline (ZOLOFT) 50 MG tablet [Pharmacy Med Name: Sertraline Hydrochloride 50mg  Tablet] 90 tablet 0    Sig: Take 1 tablet by mouth daily. Take with the 100 mg tablet to make a 150 mg dose.     Psychiatry:  Antidepressants - SSRI - sertraline Failed - 03/09/2023  2:46 PM      Failed - AST in normal range and within 360 days    AST  Date Value Ref Range Status  08/19/2021 17 0 - 40 IU/L Final   SGOT(AST)  Date Value Ref Range Status  08/01/2013 28 15 - 37 Unit/L Final         Failed - ALT in normal range and within 360 days    ALT  Date Value Ref Range Status  08/19/2021 16 0 - 32 IU/L Final   SGPT (ALT)  Date Value Ref Range Status  08/01/2013 23 12 - 78 U/L Final         Passed - Completed PHQ-2 or PHQ-9 in the last 360 days      Passed - Valid encounter within last 6 months    Recent Outpatient Visits           5 months ago Moderate episode of recurrent major depressive disorder [facility])   [place] [facility] Larae Grooms, NP   1 year ago Weight gain   [place] [facility] Larae Grooms, NP   2 years ago Moderate episode of recurrent major depressive disorder [facility])   [place] [facility] Larae Grooms, NP   2 years ago Moderate episode of recurrent major depressive disorder [facility])   [place] [facility] Gabriel Cirri, NP   3 years ago Anxiety   [place] [facility] [place], [place], [place]       Future Appointments             In 2 weeks Larae Grooms, NP [place] [facility]

## 2023-03-14 ENCOUNTER — Other Ambulatory Visit: Payer: Self-pay | Admitting: Nurse Practitioner

## 2023-03-15 NOTE — Telephone Encounter (Signed)
Unable to refill per protocol, Rx request is too soon. Last refill 03/07/23 for 30 days.  Requested Prescriptions  Pending Prescriptions Disp Refills   hydrOXYzine (ATARAX) 50 MG tablet [Pharmacy Med Name: Hydroxyzine Hydrochloride 50mg  Tablet] 30 tablet 0    Sig: Take 1 tablet by mouth at bedtime as needed for anxiety or sleep.     Ear, Nose, and Throat:  Antihistamines 2 Failed - 03/14/2023  3:15 PM      Failed - Cr in normal range and within 360 days    Creatinine  Date Value Ref Range Status  08/01/2013 0.78 0.60 - 1.30 mg/dL Final   Creatinine, Ser  Date Value Ref Range Status  08/19/2021 0.65 0.57 - 1.00 mg/dL Final   Creatinine, Urine  Date Value Ref Range Status  01/16/2019 72.77 mg/dL Final         Passed - Valid encounter within last 12 months    Recent Outpatient Visits           5 months ago Moderate episode of recurrent major depressive disorder [facility])   [place] [facility] Larae Grooms, NP   1 year ago Weight gain   [place] [facility] Larae Grooms, NP   2 years ago Moderate episode of recurrent major depressive disorder [facility])   [place] [facility] Larae Grooms, NP   2 years ago Moderate episode of recurrent major depressive disorder [facility])   [place] [facility] Gabriel Cirri, NP   3 years ago Anxiety   [place] [facility] [place], [place], [place]       Future Appointments             In 2 weeks Larae Grooms, NP [place] [facility], [facility]

## 2023-03-29 ENCOUNTER — Encounter: Payer: 59 | Admitting: Nurse Practitioner

## 2023-05-17 ENCOUNTER — Other Ambulatory Visit: Payer: Self-pay | Admitting: Nurse Practitioner

## 2023-05-18 MED ORDER — SERTRALINE HCL 50 MG PO TABS: ORAL_TABLET | ORAL | 0 refills | Status: DC

## 2023-05-18 MED ORDER — SERTRALINE HCL 100 MG PO TABS: 100.0000 mg | ORAL_TABLET | Freq: Every day | ORAL | 0 refills | Status: DC

## 2023-07-24 ENCOUNTER — Other Ambulatory Visit: Payer: Self-pay | Admitting: Physician Assistant

## 2023-07-25 NOTE — Telephone Encounter (Signed)
Requested medications are due for refill today.  yes  Requested medications are on the active medications list.  yes  Last refill. 05/18/2023 #30 0 rf  Future visit scheduled.   no  Notes to clinic.  Labs are expired. Pt already given a courtesy refill. Pt is more than 3 months overdue for an office visit.    Requested Prescriptions  Pending Prescriptions Disp Refills   sertraline (ZOLOFT) 50 MG tablet [Pharmacy Med Name: Sertraline Hydrochloride 50mg  Tablet] 30 tablet 0    Sig: Take 1 tablet by mouth daily. Take with the 100 mg tablet to make a 150 mg dose.     Psychiatry:  Antidepressants - SSRI - sertraline Failed - 07/24/2023  1:16 PM      Failed - AST in normal range and within 360 days    AST  Date Value Ref Range Status  08/19/2021 17 0 - 40 IU/L Final   SGOT(AST)  Date Value Ref Range Status  08/01/2013 28 15 - 37 Unit/L Final         Failed - ALT in normal range and within 360 days    ALT  Date Value Ref Range Status  08/19/2021 16 0 - 32 IU/L Final   SGPT (ALT)  Date Value Ref Range Status  08/01/2013 23 12 - 78 U/L Final         Failed - Valid encounter within last 6 months    Recent Outpatient Visits           10 months ago Moderate episode of recurrent major depressive disorder (HCC)   [place] [facility] Lar[facility] G[facility], NP   1 year ago Weight gain   [place] [facility] Lar[facility], NP   2 years ago Moderate episode of recurrent major depressive disorder [facility])   [place] [facility], NP   3 years ago Moderate episode of recurrent major depressive disorder [facility])    [facility] Gabriel Cirri, NP   3 years ago Anxiety    [place] [place], [place]              Passed - Completed PHQ-2 or PHQ-9 in the last 360 days

## 2023-08-24 ENCOUNTER — Telehealth: Payer: 59 | Admitting: Nurse Practitioner

## 2023-08-24 ENCOUNTER — Encounter: Payer: Self-pay | Admitting: Nurse Practitioner

## 2023-08-24 VITALS — Ht 60.0 in | Wt 150.0 lb

## 2023-08-24 DIAGNOSIS — F331 Major depressive disorder, recurrent, moderate: Secondary | ICD-10-CM

## 2023-08-24 DIAGNOSIS — F419 Anxiety disorder, unspecified: Secondary | ICD-10-CM

## 2023-08-24 MED ORDER — SERTRALINE HCL 100 MG PO TABS: 100.0000 mg | ORAL_TABLET | Freq: Every day | ORAL | 1 refills | Status: DC

## 2023-08-24 MED ORDER — SERTRALINE HCL 50 MG PO TABS: ORAL_TABLET | ORAL | 1 refills | Status: DC

## 2023-08-24 MED ORDER — HYDROXYZINE HCL 50 MG PO TABS: 50.0000 mg | ORAL_TABLET | Freq: Three times a day (TID) | ORAL | 0 refills | Status: DC | PRN

## 2023-08-24 NOTE — Assessment & Plan Note (Signed)
Chronic.  Controlled.  Continue with current medication regimen of Zoloft 150mg  daily.  Also uses PRN hydroxyzine.  Refills sent today.  Labs ordered today.  Return to clinic in 6 months for reevaluation.  Call sooner if concerns arise.

## 2023-08-24 NOTE — Progress Notes (Signed)
Ht 5' (1.524 m)   Wt 150 lb (68 kg)   BMI 29.29 kg/m    Subjective:    Patient ID: Rachel [name], female    DOB: 06/20/1986, 36 y.o.   MRN: [id number]  HPI: Rachel [name] is a 37 y.o. female  Chief Complaint  Patient presents with   Medication Refill    Zoloft 50 and 100    DEPRESSION Patient states she feels like things are going well.  Feels like she is doing well.  She feels like these are good doses of medication for her.  Using the hydroxyzine as needed.  Denies SI.      09/27/2022    2:43 PM 08/19/2021    9:39 AM 02/22/2021    3:31 PM 06/25/2020    2:43 PM 06/25/2020    2:33 PM  Depression screen PHQ 2/9  Decreased Interest 0 0 0 0 0  Down, Depressed, Hopeless 0 0 0 0 0  PHQ - 2 Score 0 0 0 0 0  Altered sleeping 0 1 0 3 1  Tired, decreased energy 0 0 1 1 0  Change in appetite 2 0 0 0 0  Feeling bad or failure about yourself  0 0 0 0 0  Trouble concentrating 0 0 1 1 1   Moving slowly or fidgety/restless 0 0 0 0 0  Suicidal thoughts 0 0 0 0 0  PHQ-9 Score 2 1 2 5 2   Difficult doing work/chores Somewhat difficult Not difficult at all  Somewhat difficult Not difficult at all      09/27/2022    2:44 PM 08/19/2021    9:40 AM 02/22/2021    3:32 PM 06/25/2020    2:35 PM  GAD 7 : Generalized Anxiety Score  Nervous, Anxious, on Edge 1 0 1 1  Control/stop worrying 1 0 3 0  Worry too much - different things 1 0 2 0  Trouble relaxing 1 0 3 2  Restless 1 0 0 0  Easily annoyed or irritable 0 1 3 1   Afraid - awful might happen 0 0 0 1  Total GAD 7 Score 5 1 12 5   Anxiety Difficulty Somewhat difficult Not difficult at all  Somewhat difficult      Relevant past medical, surgical, family and social history reviewed and updated as indicated. Interim medical history since our last visit reviewed. Allergies and medications reviewed and updated.  Review of Systems  Gastrointestinal:  Negative for nausea.  Psychiatric/Behavioral:  Positive for dysphoric mood. Negative  for suicidal ideas. The patient is nervous/anxious.     Per HPI unless specifically indicated above     Objective:    Ht 5' (1.524 m)   Wt 150 lb (68 kg)   BMI 29.29 kg/m   Wt Readings from Last 3 Encounters:  08/24/23 150 lb (68 kg)  09/27/22 158 lb 14.4 oz (72.1 kg)  06/01/22 170 lb (77.1 kg)    Physical Exam Vitals and nursing note reviewed.  Constitutional:      General: She is not in acute distress.    Appearance: She is not ill-appearing.  HENT:     Head: Normocephalic.     Right Ear: Hearing normal.     Left Ear: Hearing normal.     Nose: Nose normal.  Pulmonary:     Effort: Pulmonary effort is normal. No respiratory distress.  Neurological:     Mental Status: She is alert.  Psychiatric:  Mood and Affect: Mood normal.        Behavior: Behavior normal.        Thought Content: Thought content normal.        Judgment: Judgment normal.     Results for orders placed or performed in visit on 07/19/22  HM PAP SMEAR  Result Value Ref Range   HM Pap smear see result scanned into chart       Assessment & Plan:   Problem List Items Addressed This Visit       Other   Moderate episode of recurrent major depressive disorder (HCC) - Primary    Chronic.  Controlled.  Continue with current medication regimen of Zoloft 150mg  daily.  Also uses PRN hydroxyzine.  Refills sent today.  Labs ordered today.  Return to clinic in 6 months for reevaluation.  Call sooner if concerns arise.        Relevant Medications   sertraline (ZOLOFT) 50 MG tablet   sertraline (ZOLOFT) 100 MG tablet   hydrOXYzine (ATARAX) 50 MG tablet   Anxiety    Chronic.  Controlled.  Continue with current medication regimen of Zoloft 150mg  daily.  Also uses PRN hydroxyzine.  Refills sent today.  Labs ordered today.  Return to clinic in 6 months for reevaluation.  Call sooner if concerns arise.       Relevant Medications   sertraline (ZOLOFT) 50 MG tablet   sertraline (ZOLOFT) 100 MG tablet    hydrOXYzine (ATARAX) 50 MG tablet      Follow up plan: Return in about 6 months (around 02/21/2024) for Physical and Fasting labs.   This visit was completed via MyChart due to the restrictions of the COVID-19 pandemic. All issues as above were discussed and addressed. Physical exam was done as above through visual confirmation on MyChart. If it was felt that the patient should be evaluated in the office, they were directed there. The patient verbally consented to this visit. Location of the patient: Home Location of the provider: Office Those involved with this call:  Provider: Larae Grooms, NP CMA: Oswaldo Conroy, CMA Front Desk/Registration: Servando Snare This encounter was conducted via video.  I spent 20 dedicated to the care of this patient on the date of this encounter to include previsit review of symptoms, plan of care and follow up, face to face time with the patient, and post visit ordering of testing.

## 2023-08-25 NOTE — Progress Notes (Signed)
Called and scheduled physical on 02/22/2024 @ 1:00 pm.

## 2023-08-31 ENCOUNTER — Other Ambulatory Visit: Payer: Self-pay | Admitting: Nurse Practitioner

## 2023-09-04 NOTE — Telephone Encounter (Signed)
Requested medication (s) are due for refill today: No  Requested medication (s) are on the active medication list: Yes  Last refill:  08/24/23  Future visit scheduled: Yes  Notes to clinic:  Pharmacy request.    Requested Prescriptions  Pending Prescriptions Disp Refills   hydrOXYzine (ATARAX) 50 MG tablet [Pharmacy Med Name: Hydroxyzine Hydrochloride 50mg  Tablet] 90 tablet 0    Sig: Take 1 tablet by mouth three times daily as needed.     Ear, Nose, and Throat:  Antihistamines 2 Failed - 08/31/2023  3:05 PM      Failed - Cr in normal range and within 360 days    Creatinine  Date Value Ref Range Status  08/01/2013 0.78 0.60 - 1.30 mg/dL Final   Creatinine, Ser  Date Value Ref Range Status  08/19/2021 0.65 0.57 - 1.00 mg/dL Final   Creatinine, Urine  Date Value Ref Range Status  01/16/2019 72.77 mg/dL Final         Passed - Valid encounter within last 12 months    Recent Outpatient Visits           1 week ago Moderate episode of recurrent major depressive disorder [facility])   [place] [facility] Larae Grooms, NP   11 months ago Moderate episode of recurrent major depressive disorder [facility])   [place] [facility] Larae Grooms, NP   2 years ago Weight gain   [place] [facility] Larae Grooms, NP   2 years ago Moderate episode of recurrent major depressive disorder [facility])   [place] [facility] Larae Grooms, NP   3 years ago Moderate episode of recurrent major depressive disorder [facility])   [place] [facility] Gabriel Cirri, NP       Future Appointments             In 5 months Larae Grooms, NP [place] [facility]

## 2023-09-26 ENCOUNTER — Other Ambulatory Visit: Payer: Self-pay | Admitting: Nurse Practitioner

## 2023-09-28 NOTE — Telephone Encounter (Signed)
Requested medication (s) are due for refill today: Yes  Requested medication (s) are on the active medication list: Yes  Last refill:  09/04/23  Future visit scheduled: Yes  Notes to clinic:  Unable to refill per protocol due to failed labs, no updated results.      Requested Prescriptions  Pending Prescriptions Disp Refills   hydrOXYzine (ATARAX) 50 MG tablet [Pharmacy Med Name: Hydroxyzine Hydrochloride 50mg  Tablet] 90 tablet 0    Sig: Take 1 tablet by mouth three times daily as needed.     Ear, Nose, and Throat:  Antihistamines 2 Failed - 09/28/2023 11:41 AM      Failed - Cr in normal range and within 360 days    Creatinine  Date Value Ref Range Status  08/01/2013 0.78 0.60 - 1.30 mg/dL Final   Creatinine, Ser  Date Value Ref Range Status  08/19/2021 0.65 0.57 - 1.00 mg/dL Final   Creatinine, Urine  Date Value Ref Range Status  01/16/2019 72.77 mg/dL Final         Passed - Valid encounter within last 12 months    Recent Outpatient Visits           1 month ago Moderate episode of recurrent major depressive disorder [facility])   [facility] Larae Grooms, NP   1 year ago Moderate episode of recurrent major depressive disorder [facility])   [place] [facility] Larae Grooms, NP   2 years ago Weight gain   [place] [facility] Larae Grooms, NP   2 years ago Moderate episode of recurrent major depressive disorder [facility])   [place] [facility] Larae Grooms, NP   3 years ago Moderate episode of recurrent major depressive disorder [facility])   [place] [facility] Gabriel Cirri, NP       Future Appointments             In 4 months Larae Grooms, NP [place] [facility], [facility]

## 2024-01-09 ENCOUNTER — Other Ambulatory Visit: Payer: Self-pay | Admitting: Nurse Practitioner

## 2024-01-10 NOTE — Telephone Encounter (Signed)
 Requested by interface surescripts. Future visit in 1 month . Requested Prescriptions  Pending Prescriptions Disp Refills   sertraline (ZOLOFT) 100 MG tablet [Pharmacy Med Name: Sertraline Hydrochloride 100mg  Tablet] 90 tablet 0    Sig: Take 1 tablet by mouth daily.     Psychiatry:  Antidepressants - SSRI - sertraline Failed - 01/10/2024  2:49 PM      Failed - AST in normal range and within 360 days    AST  Date Value Ref Range Status  08/19/2021 17 0 - 40 IU/L Final   SGOT(AST)  Date Value Ref Range Status  08/01/2013 28 15 - 37 Unit/L Final         Failed - ALT in normal range and within 360 days    ALT  Date Value Ref Range Status  08/19/2021 16 0 - 32 IU/L Final   SGPT (ALT)  Date Value Ref Range Status  08/01/2013 23 12 - 78 U/L Final         Failed - Completed PHQ-2 or PHQ-9 in the last 360 days      Failed - Valid encounter within last 6 months    Recent Outpatient Visits   None     Future Appointments             In 1 month Larae Grooms, NP [place] [facility]

## 2024-01-10 NOTE — Telephone Encounter (Signed)
 Requested Prescriptions  Pending Prescriptions Disp Refills   sertraline (ZOLOFT) 50 MG tablet [Pharmacy Med Name: Sertraline Hydrochloride 50mg  Tablet] 90 tablet 0    Sig: Take 1 tablet by mouth daily. Take with the 100 mg tablet to make a 150 mg dose.     Psychiatry:  Antidepressants - SSRI - sertraline Failed - 01/10/2024  1:07 PM      Failed - AST in normal range and within 360 days    AST  Date Value Ref Range Status  08/19/2021 17 0 - 40 IU/L Final   SGOT(AST)  Date Value Ref Range Status  08/01/2013 28 15 - 37 Unit/L Final         Failed - ALT in normal range and within 360 days    ALT  Date Value Ref Range Status  08/19/2021 16 0 - 32 IU/L Final   SGPT (ALT)  Date Value Ref Range Status  08/01/2013 23 12 - 78 U/L Final         Failed - Completed PHQ-2 or PHQ-9 in the last 360 days      Failed - Valid encounter within last 6 months    Recent Outpatient Visits   None     Future Appointments             In 1 month Larae Grooms, NP [place] [facility]

## 2024-02-22 ENCOUNTER — Ambulatory Visit (INDEPENDENT_AMBULATORY_CARE_PROVIDER_SITE_OTHER): Payer: Self-pay | Admitting: Nurse Practitioner

## 2024-02-22 ENCOUNTER — Encounter: Payer: Self-pay | Admitting: Nurse Practitioner

## 2024-02-22 VITALS — BP 90/59 | HR 86 | Temp 98.4°F | Resp 15 | Ht 60.0 in | Wt 123.4 lb

## 2024-02-22 DIAGNOSIS — Z0000 Encounter for general adult medical examination without abnormal findings: Secondary | ICD-10-CM

## 2024-02-22 DIAGNOSIS — Z136 Encounter for screening for cardiovascular disorders: Secondary | ICD-10-CM

## 2024-02-22 DIAGNOSIS — F419 Anxiety disorder, unspecified: Secondary | ICD-10-CM

## 2024-02-22 MED ORDER — HYDROXYZINE HCL 50 MG PO TABS: 50.0000 mg | ORAL_TABLET | Freq: Three times a day (TID) | ORAL | 1 refills | Status: DC | PRN

## 2024-02-22 MED ORDER — SERTRALINE HCL 50 MG PO TABS: ORAL_TABLET | ORAL | 1 refills | Status: DC

## 2024-02-22 MED ORDER — SERTRALINE HCL 100 MG PO TABS: 100.0000 mg | ORAL_TABLET | Freq: Every day | ORAL | 1 refills | Status: DC

## 2024-02-22 NOTE — Assessment & Plan Note (Signed)
Chronic.  Controlled.  Continue with current medication regimen of Zoloft 150mg daily.  Refills sent today.  Labs ordered today.  Return to clinic in 6 months for reevaluation.  Call sooner if concerns arise.  

## 2024-02-22 NOTE — Progress Notes (Signed)
 BP (!) 90/59 (BP Location: Left Arm, Patient Position: Sitting, Cuff Size: Normal)   Pulse 86   Temp 98.4 F (36.9 C) (Oral)   Resp 15   Ht 5' (1.524 m)   Wt 123 lb 6.4 oz (56 kg)   LMP 02/12/2024 (Exact Date)   SpO2 98%   Breastfeeding No   BMI 24.10 kg/m    Subjective:    Patient ID: Rachel [name], female    DOB: 1986-07-14, 38 y.o.   MRN: [id number]  HPI: Rachel [name] is a 38 y.o. female presenting on 02/22/2024 for comprehensive medical examination. Current medical complaints include:none  She currently lives with: Menopausal Symptoms: no  DEPRESSION/ANXIETY Patient states she feels like things are going well.  She feels like this is a good dose of medication.  She feels like the post partum is Finally wearing off and feeling herself.  Using the hydroxyzine  as needed.  Denies SI.    Depression Screen done today and results listed below:     02/22/2024    1:16 PM 09/27/2022    2:43 PM 08/19/2021    9:39 AM 02/22/2021    3:31 PM 06/25/2020    2:43 PM  Depression screen PHQ 2/9  Decreased Interest 0 0 0 0 0  Down, Depressed, Hopeless 0 0 0 0 0  PHQ - 2 Score 0 0 0 0 0  Altered sleeping 2 0 1 0 3  Tired, decreased energy 2 0 0 1 1  Change in appetite 0 2 0 0 0  Feeling bad or failure about yourself  0 0 0 0 0  Trouble concentrating 2 0 0 1 1  Moving slowly or fidgety/restless 0 0 0 0 0  Suicidal thoughts 0 0 0 0 0  PHQ-9 Score 6 2 1 2 5   Difficult doing work/chores Somewhat difficult Somewhat difficult Not difficult at all  Somewhat difficult    The patient does not have a history of falls. I did complete a risk assessment for falls. A plan of care for falls was documented.   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   Depression    medicated, doing well   Elevated transaminase level    GERD (gastroesophageal reflux disease)    with pregnancy only   Gestational  diabetes    History of alcoholism (HCC)    History of substance abuse (HCC)    Mini stroke 2012   alcohol related   Osteoporosis    Pregnancy induced hypertension    Seizures (HCC)    from alcohol, has been sober for over 13yrs    Surgical History:  Past Surgical History:  Procedure Laterality Date   CESAREAN SECTION N/A 01/16/2019   Procedure: CESAREAN SECTION;  Surgeon: Zora Hires, MD;  Location: [facility]S;  Service: Obstetrics;  Laterality: N/A;   CESAREAN SECTION N/A 06/01/2022   Procedure: REPEAT CESAREAN SECTION;  Surgeon: Kris Pester, MD;  Location: [facility] ORS;  Service: Obstetrics;  Laterality: N/A;   ELBOW ARTHROSCOPY Left 01/02/2020   Procedure: LEFT ELBOW ARTHROSCOPY WITH FOREIGN BODY REMOVAL;  Surgeon: Elner Hahn, MD;  Location: [facility] ORS;  Service: Orthopedics;  Laterality: Left;   ELBOW SURGERY Bilateral    KNEE ARTHROSCOPY Bilateral    TONSILLECTOMY      Medications:  Current Outpatient Medications on File Prior to  Visit  Medication Sig   [DISCONTINUED] escitalopram (LEXAPRO) 10 MG tablet Take 10 mg by mouth daily.   No current facility-administered medications on file prior to visit.    Allergies:  Allergies  Allergen Reactions   Amoxicillin Shortness Of Breath and Rash   Penicillins Shortness Of Breath and Rash    Did it involve swelling of the face/tongue/throat, SOB, or low BP? Yes Did it involve sudden or severe rash/hives, skin peeling, or any reaction on the inside of your mouth or nose? Yes Did you need to seek medical attention at a hospital or doctor's office? Yes When did it last happen?   13 years ago    If all above answers are "NO", may proceed with cephalosporin use.     Social History:  Social History   Socioeconomic History   Marital status: Married    Spouse name: Not on file   Number of children: Not on file   Years of education: Not on file   Highest education level: Not on file  Occupational History   Not on file   Tobacco Use   Smoking status: Former    Current packs/day: 0.00    Average packs/day: 0.5 packs/day for 15.0 years (7.5 ttl pk-yrs)    Types: Cigarettes    Start date: 04/15/2003    Quit date: 04/14/2018    Years since quitting: 5.8   Smokeless tobacco: Never  Vaping Use   Vaping status: Never Used  Substance and Sexual Activity   Alcohol use: No    Comment: in the past, sober since 2015   Drug use: No    Types: Marijuana    Comment: in the past   Sexual activity: Yes  Other Topics Concern   Not on file  Social History Narrative   Not on file   Social Drivers of Health   Financial Resource Strain: Low Risk  (02/22/2024)   Overall Financial Resource Strain (CARDIA)    Difficulty of Paying Living Expenses: Not hard at all  Food Insecurity: No Food Insecurity (02/22/2024)   Hunger Vital Sign    Worried About Running Out of Food in the Last Year: Never true    Ran Out of Food in the Last Year: Never true  Transportation Needs: No Transportation Needs (02/22/2024)   PRAPARE - Administrator, Civil Service (Medical): No    Lack of Transportation (Non-Medical): No  Physical Activity: Sufficiently Active (02/22/2024)   Exercise Vital Sign    Days of Exercise per Week: 7 days    Minutes of Exercise per Session: 90 min  Stress: No Stress Concern Present (02/22/2024)   [employer] of Occupational Health - Occupational Stress Questionnaire    Feeling of Stress : Not at all  Social Connections: Moderately Isolated (02/22/2024)   Social Connection and Isolation Panel [NHANES]    Frequency of Communication with Friends and Family: More than three times a week    Frequency of Social Gatherings with Friends and Family: More than three times a week    Attends Religious Services: Never    Database administrator or Organizations: No    Attends Banker Meetings: Never    Marital Status: Married  Catering manager Violence: Not At Risk (02/22/2024)   Humiliation,  Afraid, Rape, and Kick questionnaire    Fear of Current or Ex-Partner: No    Emotionally Abused: No    Physically Abused: No    Sexually Abused: No   Social History  Tobacco Use  Smoking Status Former   Current packs/day: 0.00   Average packs/day: 0.5 packs/day for 15.0 years (7.5 ttl pk-yrs)   Types: Cigarettes   Start date: 04/15/2003   Quit date: 04/14/2018   Years since quitting: 5.8  Smokeless Tobacco Never   Social History   Substance and Sexual Activity  Alcohol Use No   Comment: in the past, sober since 2015    Family History:  Family History  Problem Relation Age of Onset   Alcohol abuse Mother    Depression Mother    Cancer Mother        skin   Heart disease Mother    Drug abuse Mother    Heart attack Mother        drug related   Alcohol abuse Father    COPD Paternal Grandfather    Diabetes Paternal Grandfather    Stroke Neg Hx     Past medical history, surgical history, medications, allergies, family history and social history reviewed with patient today and changes made to appropriate areas of the chart.   Review of Systems  Psychiatric/Behavioral:  Positive for depression. Negative for suicidal ideas. The patient is nervous/anxious.    All other ROS negative except what is listed above and in the HPI.      Objective:     BP (!) 90/59 (BP Location: Left Arm, Patient Position: Sitting, Cuff Size: Normal)   Pulse 86   Temp 98.4 F (36.9 C) (Oral)   Resp 15   Ht 5' (1.524 m)   Wt 123 lb 6.4 oz (56 kg)   LMP 02/12/2024 (Exact Date)   SpO2 98%   Breastfeeding No   BMI 24.10 kg/m   Wt Readings from Last 3 Encounters:  02/22/24 123 lb 6.4 oz (56 kg)  08/24/23 150 lb (68 kg)  09/27/22 158 lb 14.4 oz (72.1 kg)    Physical Exam Vitals and nursing note reviewed.  Constitutional:      General: She is awake. She is not in acute distress.    Appearance: Normal appearance. She is well-developed. She is not ill-appearing.  HENT:     Head:  Normocephalic and atraumatic.     Right Ear: Hearing, tympanic membrane, ear canal and external ear normal. No drainage.     Left Ear: Hearing, tympanic membrane, ear canal and external ear normal. No drainage.     Nose: Nose normal.     Right Sinus: No maxillary sinus tenderness or frontal sinus tenderness.     Left Sinus: No maxillary sinus tenderness or frontal sinus tenderness.     Mouth/Throat:     Mouth: Mucous membranes are moist.     Pharynx: Oropharynx is clear. Uvula midline. No pharyngeal swelling, oropharyngeal exudate or posterior oropharyngeal erythema.  Eyes:     General: Lids are normal.        Right eye: No discharge.        Left eye: No discharge.     Extraocular Movements: Extraocular movements intact.     Conjunctiva/sclera: Conjunctivae normal.     Pupils: Pupils are equal, round, and reactive to light.     Visual Fields: Right eye visual fields normal and left eye visual fields normal.  Neck:     Thyroid : No thyromegaly.     Vascular: No carotid bruit.     Trachea: Trachea normal.  Cardiovascular:     Rate and Rhythm: Normal rate and regular rhythm.     Heart sounds: Normal heart  sounds. No murmur heard.    No gallop.  Pulmonary:     Effort: Pulmonary effort is normal. No accessory muscle usage or respiratory distress.     Breath sounds: Normal breath sounds.  Chest:  Breasts:    Right: Normal.     Left: Normal.  Abdominal:     General: Bowel sounds are normal.     Palpations: Abdomen is soft. There is no hepatomegaly or splenomegaly.     Tenderness: There is no abdominal tenderness.  Musculoskeletal:        General: Normal range of motion.     Cervical back: Normal range of motion and neck supple.     Right lower leg: No edema.     Left lower leg: No edema.  Lymphadenopathy:     Head:     Right side of head: No submental, submandibular, tonsillar, preauricular or posterior auricular adenopathy.     Left side of head: No submental, submandibular,  tonsillar, preauricular or posterior auricular adenopathy.     Cervical: No cervical adenopathy.     Upper Body:     Right upper body: No supraclavicular, axillary or pectoral adenopathy.     Left upper body: No supraclavicular, axillary or pectoral adenopathy.  Skin:    General: Skin is warm and dry.     Capillary Refill: Capillary refill takes less than 2 seconds.     Findings: No rash.  Neurological:     Mental Status: She is alert and oriented to person, place, and time.     Gait: Gait is intact.  Psychiatric:        Attention and Perception: Attention normal.        Mood and Affect: Mood normal.        Speech: Speech normal.        Behavior: Behavior normal. Behavior is cooperative.        Thought Content: Thought content normal.        Judgment: Judgment normal.     Results for orders placed or performed in visit on 07/19/22  HM PAP SMEAR   Collection Time: 11/26/21 12:00 AM  Result Value Ref Range   HM Pap smear see result scanned into chart       Assessment & Plan:   Problem List Items Addressed This Visit       Other   Anxiety   Chronic.  Controlled.  Continue with current medication regimen of Zoloft  150mg  daily.  Refills sent today.  Labs ordered today.  Return to clinic in 6 months for reevaluation.  Call sooner if concerns arise.        Relevant Medications   hydrOXYzine  (ATARAX ) 50 MG tablet   sertraline  (ZOLOFT ) 100 MG tablet   sertraline  (ZOLOFT ) 50 MG tablet   Other Visit Diagnoses       Annual physical exam    -  Primary   Health maintenance reviewd during visit.  Labs ordered.  Vaccines reviewed during visit.   Relevant Orders   CBC with Differential/Platelet   Comprehensive metabolic panel with GFR   Lipid panel   TSH     Screening for ischemic heart disease       Relevant Orders   Lipid panel        Follow up plan: Return in about 6 months (around 08/24/2024) for Depression/Anxiety FU.   LABORATORY TESTING:  - Pap smear: up to  date  IMMUNIZATIONS:   - Tdap: Tetanus vaccination status reviewed: last tetanus booster  within 10 years. - Influenza: Postponed to flu season - Pneumovax: Not applicable - Prevnar: Not applicable - COVID: Not applicable - HPV: Not applicable - Shingrix vaccine: Not applicable  SCREENING: -Mammogram: Not applicable  - Colonoscopy: Not applicable  - Bone Density: Not applicable  -Hearing Test: Not applicable  -Spirometry: Not applicable   PATIENT COUNSELING:   Advised to take 1 mg of folate supplement per day if capable of pregnancy.   Sexuality: Discussed sexually transmitted diseases, partner selection, use of condoms, avoidance of unintended pregnancy  and contraceptive alternatives.   Advised to avoid cigarette smoking.  I discussed with the patient that most people either abstain from alcohol or drink within safe limits (<=14/week and <=4 drinks/occasion for males, <=7/weeks and <= 3 drinks/occasion for females) and that the risk for alcohol disorders and other health effects rises proportionally with the number of drinks per week and how often a drinker exceeds daily limits.  Discussed cessation/primary prevention of drug use and availability of treatment for abuse.   Diet: Encouraged to adjust caloric intake to maintain  or achieve ideal body weight, to reduce intake of dietary saturated fat and total fat, to limit sodium intake by avoiding high sodium foods and not adding table salt, and to maintain adequate dietary potassium and calcium preferably from fresh fruits, vegetables, and low-fat dairy products.    stressed the importance of regular exercise  Injury prevention: Discussed safety belts, safety helmets, smoke detector, smoking near bedding or upholstery.   Dental health: Discussed importance of regular tooth brushing, flossing, and dental visits.    NEXT PREVENTATIVE PHYSICAL DUE IN 1 YEAR. Return in about 6 months (around 08/24/2024) for Depression/Anxiety  FU.

## 2024-02-23 ENCOUNTER — Ambulatory Visit: Payer: Self-pay | Admitting: Nurse Practitioner

## 2024-02-23 LAB — CBC WITH DIFFERENTIAL/PLATELET
Basophils Absolute: 0.1 10*3/uL (ref 0.0–0.2)
Basos: 1 %
EOS (ABSOLUTE): 0.1 10*3/uL (ref 0.0–0.4)
Eos: 1 %
Hematocrit: 40.4 % (ref 34.0–46.6)
Hemoglobin: 13.2 g/dL (ref 11.1–15.9)
Immature Grans (Abs): 0 10*3/uL (ref 0.0–0.1)
Immature Granulocytes: 0 %
Lymphocytes Absolute: 2 10*3/uL (ref 0.7–3.1)
Lymphs: 29 %
MCH: 28.6 pg (ref 26.6–33.0)
MCHC: 32.7 g/dL (ref 31.5–35.7)
MCV: 87 fL (ref 79–97)
Monocytes Absolute: 0.3 10*3/uL (ref 0.1–0.9)
Monocytes: 5 %
Neutrophils Absolute: 4.4 10*3/uL (ref 1.4–7.0)
Neutrophils: 64 %
Platelets: 285 10*3/uL (ref 150–450)
RBC: 4.62 x10E6/uL (ref 3.77–5.28)
RDW: 13.1 % (ref 11.7–15.4)
WBC: 6.8 10*3/uL (ref 3.4–10.8)

## 2024-02-23 LAB — COMPREHENSIVE METABOLIC PANEL WITH GFR
ALT: 20 IU/L (ref 0–32)
AST: 20 IU/L (ref 0–40)
Albumin: 4.5 g/dL (ref 3.9–4.9)
Alkaline Phosphatase: 72 IU/L (ref 44–121)
BUN/Creatinine Ratio: 20 (ref 9–23)
BUN: 14 mg/dL (ref 6–20)
Bilirubin Total: 0.4 mg/dL (ref 0.0–1.2)
CO2: 25 mmol/L (ref 20–29)
Calcium: 9.7 mg/dL (ref 8.7–10.2)
Chloride: 101 mmol/L (ref 96–106)
Creatinine, Ser: 0.69 mg/dL (ref 0.57–1.00)
Globulin, Total: 2.4 g/dL (ref 1.5–4.5)
Glucose: 91 mg/dL (ref 70–99)
Potassium: 4.3 mmol/L (ref 3.5–5.2)
Sodium: 139 mmol/L (ref 134–144)
Total Protein: 6.9 g/dL (ref 6.0–8.5)
eGFR: 115 mL/min/{1.73_m2} (ref >59)

## 2024-02-23 LAB — TSH: TSH: 1.26 u[IU]/mL (ref 0.450–4.500)

## 2024-02-23 LAB — LIPID PANEL
Chol/HDL Ratio: 3.4 ratio (ref 0.0–4.4)
Cholesterol, Total: 209 mg/dL — ABNORMAL HIGH (ref 100–199)
HDL: 61 mg/dL (ref >39)
LDL Chol Calc (NIH): 133 mg/dL — ABNORMAL HIGH (ref 0–99)
Triglycerides: 86 mg/dL (ref 0–149)
VLDL Cholesterol Cal: 15 mg/dL (ref 5–40)

## 2024-03-26 ENCOUNTER — Other Ambulatory Visit: Payer: Self-pay | Admitting: Nurse Practitioner

## 2024-03-28 NOTE — Telephone Encounter (Signed)
 Too soon for refill.  Requested Prescriptions  Pending Prescriptions Disp Refills   hydrOXYzine  (ATARAX ) 50 MG tablet [Pharmacy Med Name: Hydroxyzine  Hydrochloride 50mg  Tablet] 90 tablet 0    Sig: Take 1 tablet by mouth three times daily as needed.     Ear, Nose, and Throat:  Antihistamines 2 Passed - 03/28/2024 11:44 AM      Passed - Cr in normal range and within 360 days    Creatinine  Date Value Ref Range Status  08/01/2013 0.78 0.60 - 1.30 mg/dL Final   Creatinine, Ser  Date Value Ref Range Status  02/22/2024 0.69 0.57 - 1.00 mg/dL Final   Creatinine, Urine  Date Value Ref Range Status  01/16/2019 72.77 mg/dL Final         Passed - Valid encounter within last 12 months    Recent Outpatient Visits           1 month ago Annual physical exam   [place] [facility] Melvin Pao, NP

## 2024-08-26 ENCOUNTER — Encounter: Payer: Self-pay | Admitting: Nurse Practitioner

## 2024-08-26 ENCOUNTER — Ambulatory Visit (INDEPENDENT_AMBULATORY_CARE_PROVIDER_SITE_OTHER): Admitting: Nurse Practitioner

## 2024-08-26 VITALS — BP 116/73 | HR 85 | Temp 98.1°F | Ht 60.0 in | Wt 123.2 lb

## 2024-08-26 DIAGNOSIS — F331 Major depressive disorder, recurrent, moderate: Secondary | ICD-10-CM

## 2024-08-26 MED ORDER — SERTRALINE HCL 50 MG PO TABS: ORAL_TABLET | ORAL | 1 refills | Status: AC

## 2024-08-26 MED ORDER — HYDROXYZINE HCL 50 MG PO TABS: 50.0000 mg | ORAL_TABLET | Freq: Three times a day (TID) | ORAL | 1 refills | Status: AC | PRN

## 2024-08-26 MED ORDER — SERTRALINE HCL 100 MG PO TABS: 100.0000 mg | ORAL_TABLET | Freq: Every day | ORAL | 1 refills | Status: AC

## 2024-08-26 NOTE — Assessment & Plan Note (Addendum)
 Chronic.  Controlled.  Continue with current medication regimen of Zoloft  150mg  daily.  Also uses PRN hydroxyzine .  Refills sent today.  Return to clinic in 6 months for reevaluation.  Call sooner if concerns arise.

## 2024-08-26 NOTE — Progress Notes (Signed)
 BP 116/73 (BP Location: Right Arm, Patient Position: Sitting, Cuff Size: Normal)   Pulse 85   Temp 98.1 F (36.7 C) (Oral)   Ht 5' (1.524 m)   Wt 123 lb 3.2 oz (55.9 kg)   LMP 07/30/2024 (Exact Date)   SpO2 96%   BMI 24.06 kg/m    Subjective:    Patient ID: Rachel [name], female    DOB: January 03, 1986, 38 y.o.   MRN: [id number]  HPI: Rachel [name] is a 38 y.o. female  Chief Complaint  Patient presents with   Depression   Anxiety    No changes to report(patient stated)   DEPRESSION/ANXIETY Patient states she feels like things are going well.  She feels like this is a good dose of medication.  States this is a good regimen for her.  Denies needing any changes.  Using the hydroxyzine  as needed.  Denies SI.   Flowsheet Row Office Visit from 08/26/2024 in [facility]  PHQ-9 Total Score 3      08/26/2024    1:29 PM 02/22/2024    1:17 PM 09/27/2022    2:44 PM 08/19/2021    9:40 AM  GAD 7 : Generalized Anxiety Score  Nervous, Anxious, on Edge 0 0 1 0  Control/stop worrying 0 0 1 0  Worry too much - different things 0 0 1 0  Trouble relaxing 1 1 1  0  Restless 0 0 1 0  Easily annoyed or irritable 1 1 0 1  Afraid - awful might happen 0 0 0 0  Total GAD 7 Score 2 2 5 1   Anxiety Difficulty Not difficult at all  Somewhat difficult Not difficult at all      Relevant past medical, surgical, family and social history reviewed and updated as indicated. Interim medical history since our last visit reviewed. Allergies and medications reviewed and updated.  Review of Systems  Psychiatric/Behavioral:  Positive for dysphoric mood. Negative for suicidal ideas. The patient is nervous/anxious.     Per HPI unless specifically indicated above     Objective:    BP 116/73 (BP Location: Right Arm, Patient Position: Sitting, Cuff Size: Normal)   Pulse 85   Temp 98.1 F (36.7 C) (Oral)   Ht 5' (1.524 m)   Wt 123 lb 3.2 oz (55.9 kg)   LMP 07/30/2024 (Exact  Date)   SpO2 96%   BMI 24.06 kg/m   Wt Readings from Last 3 Encounters:  08/26/24 123 lb 3.2 oz (55.9 kg)  02/22/24 123 lb 6.4 oz (56 kg)  08/24/23 150 lb (68 kg)    Physical Exam Vitals and nursing note reviewed.  Constitutional:      General: She is not in acute distress.    Appearance: Normal appearance. She is normal weight. She is not ill-appearing, toxic-appearing or diaphoretic.  HENT:     Head: Normocephalic.     Right Ear: External ear normal.     Left Ear: External ear normal.     Nose: Nose normal.     Mouth/Throat:     Mouth: Mucous membranes are moist.     Pharynx: Oropharynx is clear.  Eyes:     General:        Right eye: No discharge.        Left eye: No discharge.     Extraocular Movements: Extraocular movements intact.     Conjunctiva/sclera: Conjunctivae normal.     Pupils: Pupils are equal, round, and reactive to  light.  Cardiovascular:     Rate and Rhythm: Normal rate and regular rhythm.     Heart sounds: No murmur heard. Pulmonary:     Effort: Pulmonary effort is normal. No respiratory distress.     Breath sounds: Normal breath sounds. No wheezing or rales.  Musculoskeletal:     Cervical back: Normal range of motion and neck supple.  Skin:    General: Skin is warm and dry.     Capillary Refill: Capillary refill takes less than 2 seconds.  Neurological:     General: No focal deficit present.     Mental Status: She is alert and oriented to person, place, and time. Mental status is at baseline.  Psychiatric:        Mood and Affect: Mood normal.        Behavior: Behavior normal.        Thought Content: Thought content normal.        Judgment: Judgment normal.     Results for orders placed or performed in visit on 02/22/24  CBC with Differential/Platelet   Collection Time: 02/22/24  1:34 PM  Result Value Ref Range   WBC 6.8 3.4 - 10.8 x10E3/uL   RBC 4.62 3.77 - 5.28 x10E6/uL   Hemoglobin 13.2 11.1 - 15.9 g/dL   Hematocrit 59.5 65.9 - 46.6 %    MCV 87 79 - 97 fL   MCH 28.6 26.6 - 33.0 pg   MCHC 32.7 31.5 - 35.7 g/dL   RDW 86.8 88.2 - 84.5 %   Platelets 285 150 - 450 x10E3/uL   Neutrophils 64 Not Estab. %   Lymphs 29 Not Estab. %   Monocytes 5 Not Estab. %   Eos 1 Not Estab. %   Basos 1 Not Estab. %   Neutrophils Absolute 4.4 1.4 - 7.0 x10E3/uL   Lymphocytes Absolute 2.0 0.7 - 3.1 x10E3/uL   Monocytes Absolute 0.3 0.1 - 0.9 x10E3/uL   EOS (ABSOLUTE) 0.1 0.0 - 0.4 x10E3/uL   Basophils Absolute 0.1 0.0 - 0.2 x10E3/uL   Immature Granulocytes 0 Not Estab. %   Immature Grans (Abs) 0.0 0.0 - 0.1 x10E3/uL  Comprehensive metabolic panel with GFR   Collection Time: 02/22/24  1:34 PM  Result Value Ref Range   Glucose 91 70 - 99 mg/dL   BUN 14 6 - 20 mg/dL   Creatinine, Ser 9.30 0.57 - 1.00 mg/dL   eGFR 884 >40 fO/fpw/8.26   BUN/Creatinine Ratio 20 9 - 23   Sodium 139 134 - 144 mmol/L   Potassium 4.3 3.5 - 5.2 mmol/L   Chloride 101 96 - 106 mmol/L   CO2 25 20 - 29 mmol/L   Calcium 9.7 8.7 - 10.2 mg/dL   Total Protein 6.9 6.0 - 8.5 g/dL   Albumin 4.5 3.9 - 4.9 g/dL   Globulin, Total 2.4 1.5 - 4.5 g/dL   Bilirubin Total 0.4 0.0 - 1.2 mg/dL   Alkaline Phosphatase 72 44 - 121 IU/L   AST 20 0 - 40 IU/L   ALT 20 0 - 32 IU/L  Lipid panel   Collection Time: 02/22/24  1:34 PM  Result Value Ref Range   Cholesterol, Total 209 (H) 100 - 199 mg/dL   Triglycerides 86 0 - 149 mg/dL   HDL 61 >60 mg/dL   VLDL Cholesterol Cal 15 5 - 40 mg/dL   LDL Chol Calc (NIH) 866 (H) 0 - 99 mg/dL   Chol/HDL Ratio 3.4 0.0 - 4.4 ratio  TSH  Collection Time: 02/22/24  1:34 PM  Result Value Ref Range   TSH 1.260 0.450 - 4.500 uIU/mL      Assessment & Plan:   Problem List Items Addressed This Visit       Other   Moderate episode of recurrent major depressive disorder (HCC) - Primary   Chronic.  Controlled.  Continue with current medication regimen of Zoloft  150mg  daily.  Also uses PRN hydroxyzine .  Refills sent today.  Return to clinic in 6  months for reevaluation.  Call sooner if concerns arise.       Relevant Medications   hydrOXYzine  (ATARAX ) 50 MG tablet   sertraline  (ZOLOFT ) 100 MG tablet   sertraline  (ZOLOFT ) 50 MG tablet     Follow up plan: Return in about 6 months (around 02/23/2025) for Physical and Fasting labs.

## 2024-09-27 ENCOUNTER — Other Ambulatory Visit: Payer: Self-pay | Admitting: Nurse Practitioner

## 2025-02-26 ENCOUNTER — Encounter: Admitting: Nurse Practitioner
